# Patient Record
Sex: Male | Born: 1965 | Race: Asian | Hispanic: No | Marital: Married | Smoking: Never smoker
Health system: Southern US, Community
[De-identification: ages and names within clinical notes are randomized; demographics above are authoritative.]

## PROBLEM LIST (undated history)

## (undated) DIAGNOSIS — I10 Essential (primary) hypertension: Secondary | ICD-10-CM

## (undated) HISTORY — PX: APPENDECTOMY: SHX54

---

## 2005-11-07 ENCOUNTER — Ambulatory Visit: Payer: Self-pay | Admitting: Family Medicine

## 2005-12-04 ENCOUNTER — Ambulatory Visit: Payer: Self-pay | Admitting: Family Medicine

## 2005-12-05 ENCOUNTER — Encounter: Admission: RE | Admit: 2005-12-05 | Discharge: 2005-12-05 | Payer: Self-pay | Admitting: Family Medicine

## 2005-12-11 ENCOUNTER — Ambulatory Visit: Payer: Self-pay | Admitting: Internal Medicine

## 2005-12-14 ENCOUNTER — Ambulatory Visit: Payer: Self-pay | Admitting: Internal Medicine

## 2005-12-20 ENCOUNTER — Ambulatory Visit: Payer: Self-pay | Admitting: Internal Medicine

## 2006-01-04 ENCOUNTER — Ambulatory Visit: Payer: Self-pay | Admitting: Family Medicine

## 2006-01-23 ENCOUNTER — Ambulatory Visit: Payer: Self-pay | Admitting: Internal Medicine

## 2006-01-26 ENCOUNTER — Ambulatory Visit: Payer: Self-pay | Admitting: Cardiology

## 2006-02-07 ENCOUNTER — Ambulatory Visit: Payer: Self-pay | Admitting: Internal Medicine

## 2006-02-07 LAB — CONVERTED CEMR LAB
Eosinophils Relative: 2.5 % (ref 0.0–5.0)
HCT: 46.3 % (ref 39.0–52.0)
Hemoglobin: 16 g/dL (ref 13.0–17.0)
Lymphocytes Relative: 25.8 % (ref 12.0–46.0)
MCHC: 34.5 g/dL (ref 30.0–36.0)
MCV: 81.9 fL (ref 78.0–100.0)
Monocytes Relative: 9.7 % (ref 3.0–11.0)

## 2006-03-20 ENCOUNTER — Ambulatory Visit: Payer: Self-pay | Admitting: Internal Medicine

## 2006-04-24 ENCOUNTER — Ambulatory Visit: Payer: Self-pay | Admitting: Family Medicine

## 2006-08-16 ENCOUNTER — Ambulatory Visit: Payer: Self-pay | Admitting: Family Medicine

## 2006-10-10 ENCOUNTER — Ambulatory Visit: Payer: Self-pay | Admitting: Family Medicine

## 2007-06-20 ENCOUNTER — Ambulatory Visit: Payer: Self-pay | Admitting: Internal Medicine

## 2007-06-20 DIAGNOSIS — J309 Allergic rhinitis, unspecified: Secondary | ICD-10-CM | POA: Insufficient documentation

## 2007-06-20 DIAGNOSIS — J984 Other disorders of lung: Secondary | ICD-10-CM

## 2007-06-20 DIAGNOSIS — K921 Melena: Secondary | ICD-10-CM

## 2007-06-20 DIAGNOSIS — I10 Essential (primary) hypertension: Secondary | ICD-10-CM | POA: Insufficient documentation

## 2007-06-20 DIAGNOSIS — F411 Generalized anxiety disorder: Secondary | ICD-10-CM | POA: Insufficient documentation

## 2007-06-20 DIAGNOSIS — K589 Irritable bowel syndrome without diarrhea: Secondary | ICD-10-CM

## 2007-06-20 DIAGNOSIS — G43009 Migraine without aura, not intractable, without status migrainosus: Secondary | ICD-10-CM | POA: Insufficient documentation

## 2007-06-20 DIAGNOSIS — E785 Hyperlipidemia, unspecified: Secondary | ICD-10-CM | POA: Insufficient documentation

## 2007-06-20 DIAGNOSIS — F329 Major depressive disorder, single episode, unspecified: Secondary | ICD-10-CM

## 2007-06-20 DIAGNOSIS — K219 Gastro-esophageal reflux disease without esophagitis: Secondary | ICD-10-CM | POA: Insufficient documentation

## 2007-06-20 LAB — CONVERTED CEMR LAB
ALT: 33 units/L (ref 0–53)
Alkaline Phosphatase: 48 units/L (ref 39–117)
BUN: 12 mg/dL (ref 6–23)
Basophils Relative: 0.5 % (ref 0.0–1.0)
Calcium: 9.8 mg/dL (ref 8.4–10.5)
Chloride: 102 meq/L (ref 96–112)
Cholesterol: 243 mg/dL (ref 0–200)
Direct LDL: 171 mg/dL
GFR calc non Af Amer: 77 mL/min
HDL: 49 mg/dL (ref >39.0)
Hemoglobin: 16.6 g/dL (ref 13.0–17.0)
Ketones, ur: NEGATIVE mg/dL
Lymphocytes Relative: 22.1 % (ref 12.0–46.0)
MCV: 83.4 fL (ref 78.0–100.0)
Monocytes Absolute: 0.5 10*3/uL (ref 0.1–1.0)
Monocytes Relative: 8.2 % (ref 3.0–12.0)
Neutrophils Relative %: 67.5 % (ref 43.0–77.0)
RDW: 11.5 % (ref 11.5–14.6)
Sodium: 139 meq/L (ref 135–145)
Specific Gravity, Urine: 1.025 (ref 1.000–1.03)
Total Bilirubin: 0.7 mg/dL (ref 0.3–1.2)
Total CHOL/HDL Ratio: 5
Total Protein, Urine: NEGATIVE mg/dL
Total Protein: 8 g/dL (ref 6.0–8.3)
Triglycerides: 140 mg/dL (ref 0–149)
WBC: 6.3 10*3/uL (ref 4.5–10.5)
pH: 5.5 (ref 5.0–8.0)

## 2007-06-24 ENCOUNTER — Ambulatory Visit: Payer: Self-pay | Admitting: Cardiology

## 2007-07-15 ENCOUNTER — Ambulatory Visit: Payer: Self-pay | Admitting: Internal Medicine

## 2007-10-23 ENCOUNTER — Telehealth (INDEPENDENT_AMBULATORY_CARE_PROVIDER_SITE_OTHER): Payer: Self-pay | Admitting: *Deleted

## 2008-01-06 ENCOUNTER — Encounter: Payer: Self-pay | Admitting: Internal Medicine

## 2008-01-08 ENCOUNTER — Telehealth (INDEPENDENT_AMBULATORY_CARE_PROVIDER_SITE_OTHER): Payer: Self-pay | Admitting: *Deleted

## 2008-01-09 ENCOUNTER — Ambulatory Visit: Payer: Self-pay | Admitting: Cardiology

## 2008-01-14 ENCOUNTER — Telehealth (INDEPENDENT_AMBULATORY_CARE_PROVIDER_SITE_OTHER): Payer: Self-pay | Admitting: *Deleted

## 2008-01-23 ENCOUNTER — Ambulatory Visit: Payer: Self-pay | Admitting: Internal Medicine

## 2008-01-23 DIAGNOSIS — R1031 Right lower quadrant pain: Secondary | ICD-10-CM | POA: Insufficient documentation

## 2008-01-23 DIAGNOSIS — R21 Rash and other nonspecific skin eruption: Secondary | ICD-10-CM

## 2008-01-23 DIAGNOSIS — R079 Chest pain, unspecified: Secondary | ICD-10-CM | POA: Insufficient documentation

## 2008-01-23 DIAGNOSIS — J019 Acute sinusitis, unspecified: Secondary | ICD-10-CM | POA: Insufficient documentation

## 2008-01-23 LAB — CONVERTED CEMR LAB
Bacteria, UA: NEGATIVE
Basophils Relative: 0.2 % (ref 0.0–3.0)
Bilirubin Urine: NEGATIVE
CO2: 31 meq/L (ref 19–32)
Calcium: 9.8 mg/dL (ref 8.4–10.5)
Creatinine, Ser: 1.1 mg/dL (ref 0.4–1.5)
Crystals: NEGATIVE
GFR calc Af Amer: 94 mL/min
GFR calc non Af Amer: 77 mL/min
Glucose, Bld: 90 mg/dL (ref 70–99)
Hemoglobin: 15.8 g/dL (ref 13.0–17.0)
Ketones, ur: NEGATIVE mg/dL
Leukocytes, UA: NEGATIVE
Lymphocytes Relative: 23 % (ref 12.0–46.0)
MCV: 83 fL (ref 78.0–100.0)
Mucus, UA: NEGATIVE
Neutrophils Relative %: 65.7 % (ref 43.0–77.0)
Platelets: 193 10*3/uL (ref 150–400)
Potassium: 4.3 meq/L (ref 3.5–5.1)
RBC / HPF: NONE SEEN
RDW: 11.6 % (ref 11.5–14.6)
Sodium: 140 meq/L (ref 135–145)
Specific Gravity, Urine: 1.01 (ref 1.000–1.03)
Urine Glucose: NEGATIVE mg/dL
Urobilinogen, UA: 0.2 (ref 0.0–1.0)
WBC: 5.1 10*3/uL (ref 4.5–10.5)

## 2008-05-14 IMAGING — CT CT PELVIS W/ CM
2 of 5 series · 17 of 46 positions shown, 19 images · IV contrast (APPLIED)
Comparison: none

HISTORY: Periumbilical, left upper quadrant, and epigastric pain, weight loss,
dizziness, nausea, vomiting

[Series 2: abd_pel 5.0 b30f st · axial · 0.64mm/px · z∈[-402,-28]mm · 14 of 85 slices shown, 16 images]
[im 5/85  soft-tissue]
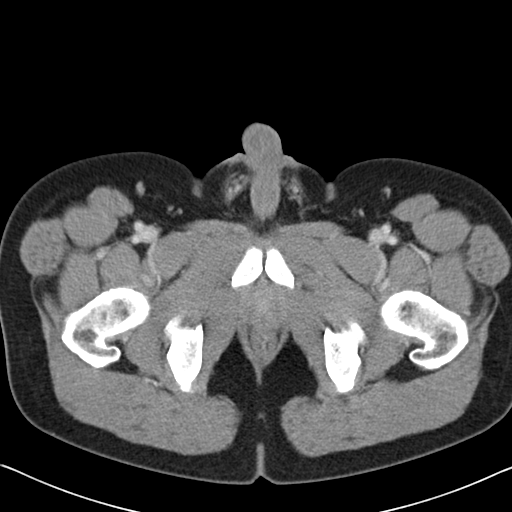
[im 5/85  bone]
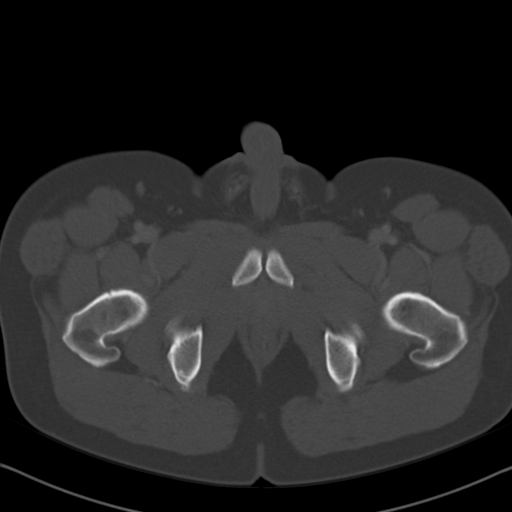
[im 9/85  soft-tissue]
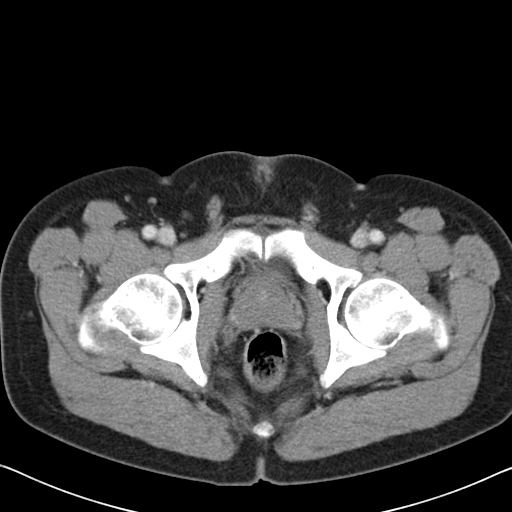
[im 18/85  soft-tissue]
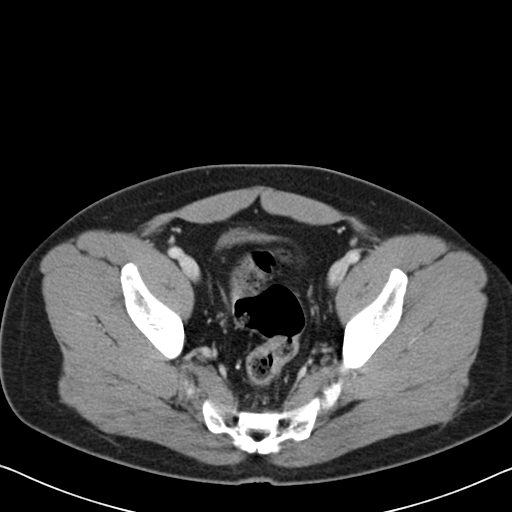
[im 23/85  soft-tissue]
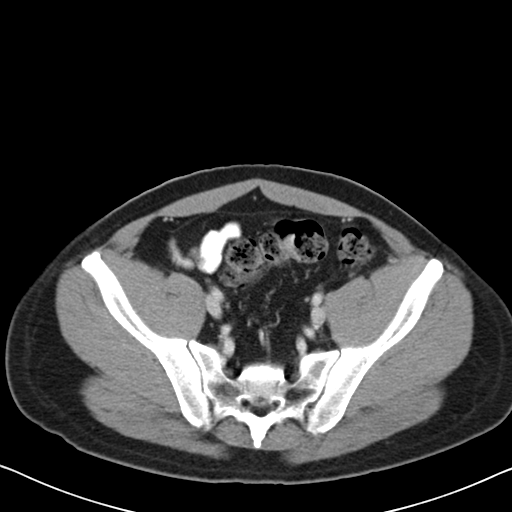
[im 27/85  soft-tissue]
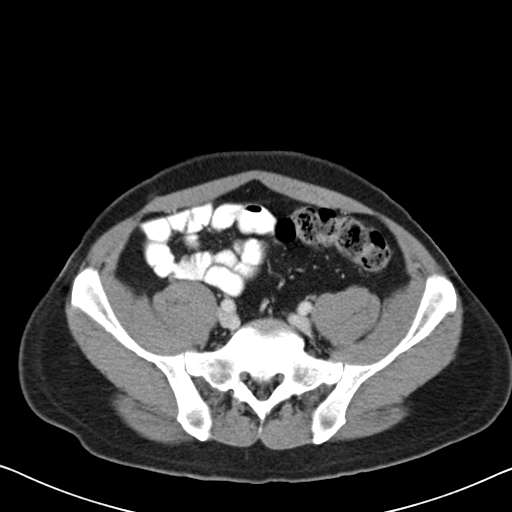
[im 36/85  soft-tissue]
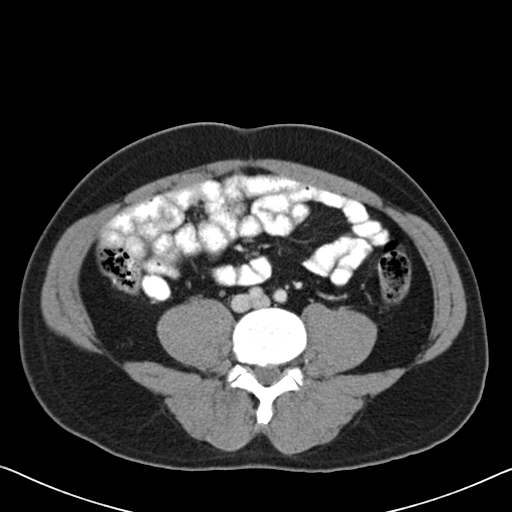
[im 40/85  soft-tissue]
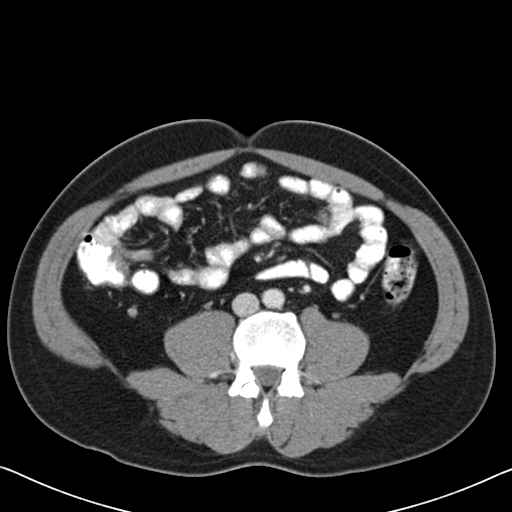
[im 45/85  soft-tissue]
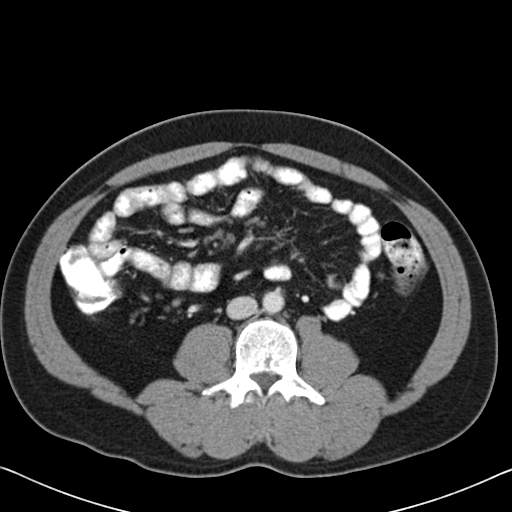
[im 49/85  soft-tissue]
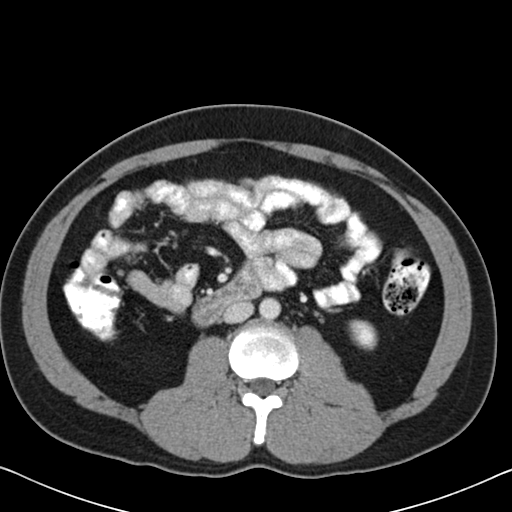
[im 49/85  bone]
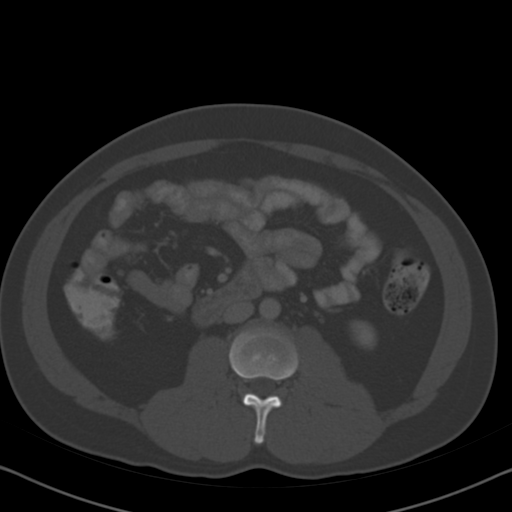
[im 58/85  soft-tissue]
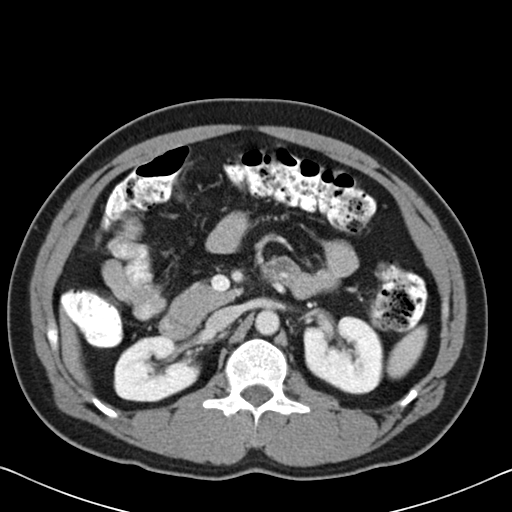
[im 62/85  soft-tissue]
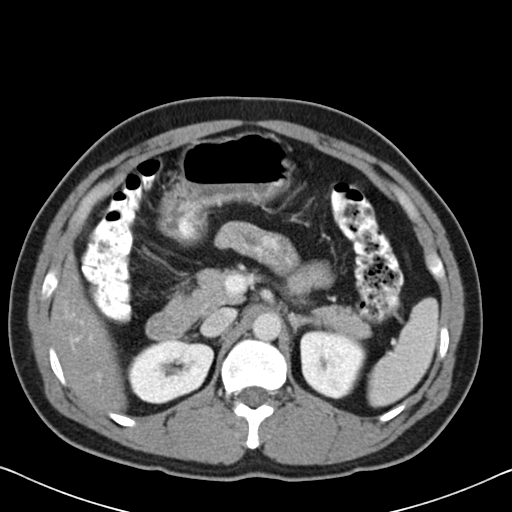
[im 67/85  soft-tissue]
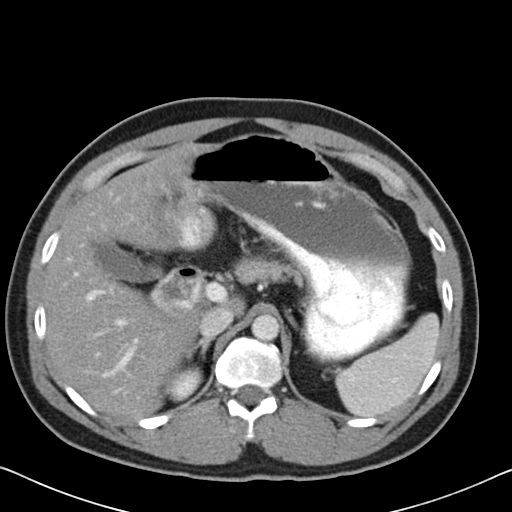
[im 76/85  soft-tissue]
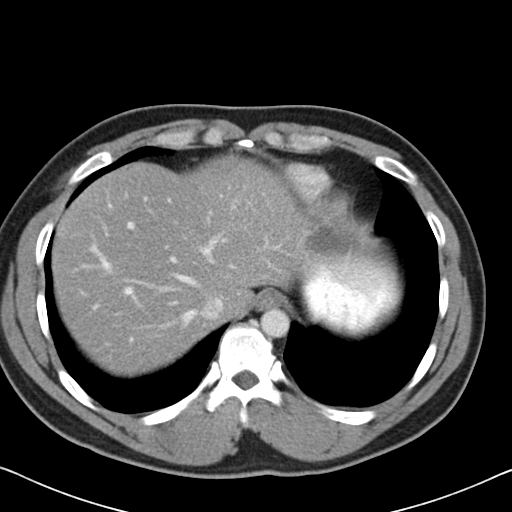
[im 80/85  soft-tissue]
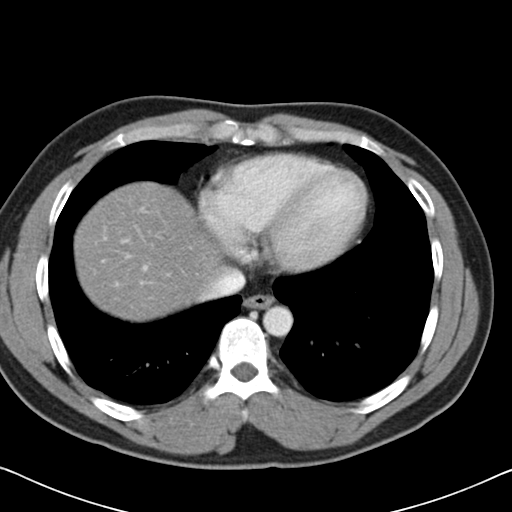

[Series 602: <mpr thick range> · coronal · 0.84mm/px · 3 of 82 slices shown]
[im 28/82  soft-tissue]
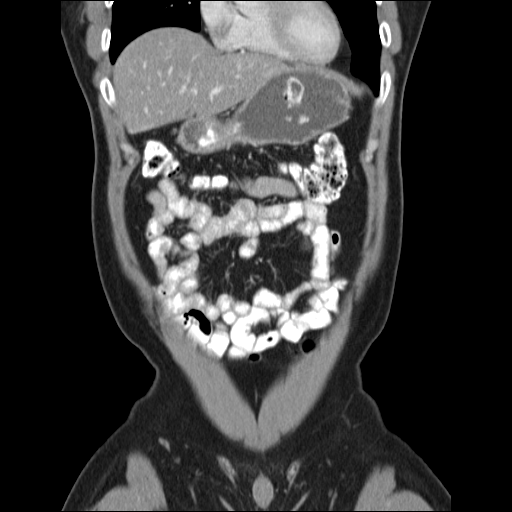
[im 37/82  soft-tissue]
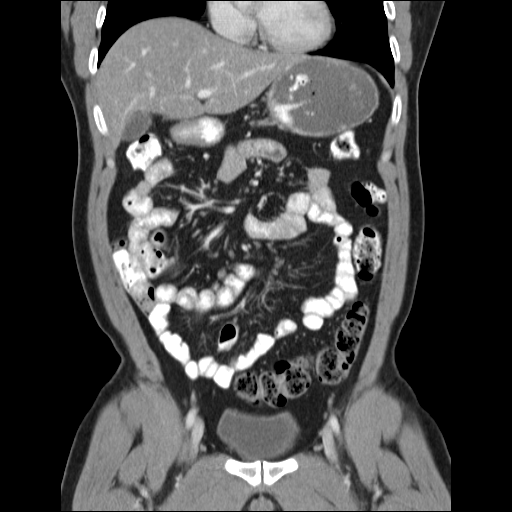
[im 46/82  soft-tissue]
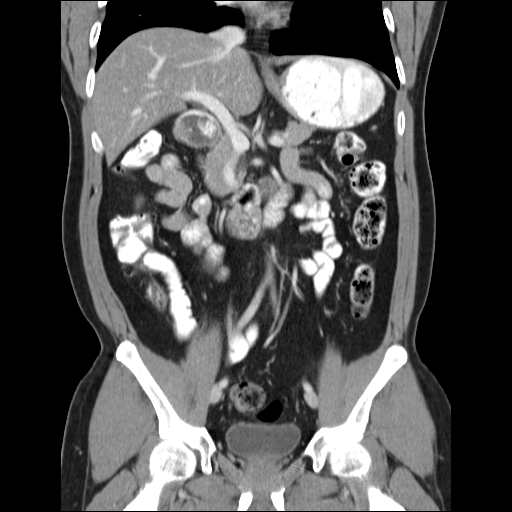

[17 of 46 positions shown; findings below may reference images not displayed]

CT ABDOMEN AND PELVIS WITH CONTRAST:

Multidetector helical CT imaging abdomen and pelvis performed.
Sagittal and coronal images are reconstructed from the axial data set.
Exam utilized dilute oral contrast and 100 cc Tmnipaque-FAA.
No prior study for comparison.

CT ABDOMEN:

Nonspecific left lower lobe pulmonary nodule 6 mm diameter image 7.
Mild diffuse fatty infiltration of liver.
Liver, spleen, pancreas, kidneys, and adrenal glands otherwise normal
appearance.
Unremarkable appearance of stomach and bowel loops in upper abdomen.
No mass, adenopathy, free fluid, or inflammatory process.
IMPRESSION: Mild fatty infiltration of liver.
No acute upper abdominal abnormalities.
Nonspecific 6 mm diameter left lower lobe pulmonary nodule.
If patient has risk factors for lung cancer, recommend followup evaluation in 6
months.
In the absence of risk factors, recommend followup in 12 months.

CT PELVIS:

Normal appearance of bladder and distal ureters.
Minimally prominent prostate gland for age 4.0 x 3.5 cm image 78.
No pelvic mass, adenopathy, free fluid, or inflammatory process.
Appendix surgically absent by history.
Bones unremarkable.
IMPRESSION: Appendectomy.
No acute pelvic abnormalities.

## 2010-05-20 NOTE — Assessment & Plan Note (Signed)
[facility]                             PULMONARY OFFICE NOTE   Bernard Hardy, Bernard Hardy                       MRN:          [id number]  DATE:12/14/2005                            DOB:          05-01-1965    REFERRING PHYSICIAN:  Sharlot Gowda, M.D.   PULMONARY CONSULTATION   CHIEF COMPLAINT:  Chest pain.   HISTORY:  A 45 year old [place] who speaks virtually no  [place] and comes in for evaluation of chest pain that has been present  for over 2 years.  This tends to happen episodically 3 to 4 times per  month, lasting several hours at a time.  The pain migrates from side to  side, but typically is worse in the left upper quadrant, always  anteriorly, never posteriorly, and never pleuritic in nature (this is  difficult to be sure of because of the language barrier).  He denies any  associated dyspnea and actually has adequate exercise tolerance despite  the stereotypical migratory pain that he is describing.  He denies any  change in bowel or bladder habits, fevers, chills, sweats, unintended  weight loss.  The patient denies the pain ever waking him from his sleep  and never has pain with exercise, typically more in the sitting  position.   PAST MEDICAL HISTORY:  Significant for hypertension.   ALLERGIES:  None known.   SOCIAL HISTORY:  He has never smoked.  [place]   FAMILY HISTORY:  Negative for respiratory diseases to his knowledge.   REVIEW OF SYSTEMS:  Taken in detail.  Negative, except as outlined  above.   PHYSICAL EXAMINATION:  This is a pleasant, healthy-appearing [place]  male in no acute distress.  VITAL SIGNS:  Stable vital signs.  HEENT:  Unremarkable.  Oropharynx is clear.  NECK:  Supple without cervical adenopathy or tenderness. Trachea is  midline.  No thyromegaly.  LUNGS:  Fields perfectly clear bilaterally to auscultation and  percussion.  CARDIAC:  Regular rate and rhythm  without murmur, gallop, or rub.  ABDOMEN:  Soft and benign.  EXTREMITIES:  Warm without calf tenderness, cyanosis, clubbing, or  edema.   Hemoglobin saturation is 95% on room air.   Chest x-ray is reviewed from December 05, 2005, and essentially normal.   IMPRESSION:  Classic irritable bowel syndrome with migratory abdominal  pain, always in the upright position, never supine, never aggravated by  exercise and not pleuritic in nature.  Worse in the left upper quadrant  where the splenic flexure occurs and tends to trap air in an upright  position.   RECOMMENDATIONS:  Citrucel 1 teaspoon b.i.d. with consideration for CT  scan of the chest and abdomen if not improve after the holidays (2 weeks  should do the trick).   I did recommend avoidance of all bean products and any products that he  knows causes gas.  I tried to do the best I could to communicate this  issue both in text and graphic format, through his interpreter.     Charlaine Dalton. Sherene Sires, MD,  FCCP  Electronically Signed    MBW/MedQ  DD: 12/14/2005  DT: 12/14/2005  Job #: [id number]   cc:   Sharlot Gowda, M.D.

## 2010-05-20 NOTE — Assessment & Plan Note (Signed)
[facility]                         GASTROENTEROLOGY OFFICE NOTE   Bernard, Hardy                       MRN:          [id number]  DATE:01/23/2006                            DOB:          09/29/1965    CHIEF COMPLAINT:  Persistent abdominal pain.   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  They were otherwise entirely normal.  This was reviewed with the  patient again today.  I prescribed Levbid which did not help his pain.  He took it for 2 weeks.  I did find out his blood pressure medicine is  HCTZ 12.5 mg daily.  At his request, I spoke to his congregational  nurse, Brantley Fling today (phone number [phone], cell, and other  number [phone]).  I explained to her what was going on, and she filled  me in that his blood pressure has been 150/118 and 140/107 at times.  He  has a lot of headaches which are thought perhaps due to bad teeth.  The  dentists have been reluctant to extract two of his teeth.  He says he  has no trouble with bowel movements.  His appetite has been good.  His  weight is down 4 pounds from December.   PAST MEDICAL HISTORY:  Reviewed and unchanged from note in December.   PHYSICAL EXAMINATION:  VITAL SIGNS:  Weight 148.4 pounds.  Pulse 60,  blood pressure 100/60.  ABDOMEN:  Mild-to-moderate periumbilical tenderness.  Bowel sounds are  increased.  There are no bruits heard.  There is a right lower quadrant  scar from prior appendectomy.   ASSESSMENT:  Persistent abdominal pain, unclear etiology.  Irritable  bowel syndrome certainly seems likely, but he is loosing some weight.  The heme-positive stool could have come from his arteriovenous  malformation.  At one point I considered the possibility of  pheochromocytoma, but I am not sure his blood pressure is high enough.   PLAN:  CT of the abdomen and pelvis  with IV and oral contrast to  investigate these symptoms and the weight loss.  He is also tender in  the periumbilical area.  If that is unrevealing, would try other  antispasmodics I think.  At some point, we will need to recheck his CBC  given the previous heme-positive stool.  Further plans pending clinical  course.  I will see him back in about a week after his CT.     Iva Boop, MD,FACG  Electronically Signed    CEG/MedQ  DD: 01/23/2006  DT: 01/23/2006  Job #: [id number]   cc:   Sharlot Gowda, M.D.

## 2010-05-20 NOTE — Assessment & Plan Note (Signed)
[facility]                         GASTROENTEROLOGY OFFICE NOTE   Bernard Hardy, Bernard Hardy                       MRN:          [id number]  DATE:03/20/2006                            DOB:          [birth year]    CHIEF COMPLAINT:  Persistent right lower quadrant pain.   I spoke to [name] through an interpreter. He continues to have some  pain in the right lower quadrant. When I examined him, he was tender  right over his previous appendectomy scar. Extensive workup outlined  previously has been unrevealing. He has been tried on hyoscyamine and  dicyclomine without relief. As I palpate, he is only tender at the site  of the scar. His weight is overall stable, down a pound or two, but  really seems okay. He does not seem to have radicular or sciatic  symptoms that I can tell. He is also complaining of rash on the right  instep of the foot that is irritated and pruritic. The skin looks rough  in that area and a little bit nodular, and he scrapes it with his finger  and says it itches through the interpreter.   ASSESSMENT:  I am not sure where his right lower quadrant pain is coming  from. However, he indicates that he has had pain since a few months or  so after his appendectomy years ago. I think he may have pain related to  his scar tissue or nerve damage from his appendectomy. I think he also  probably had some irritable bowel problems. Colonoscopy and CT scanning  have been unrevealing. He has had an upper GI endoscopy because of  problems with other abdominal pain that was negative as well.   He has a skin rash. I have suggested some hydrocortisone cream for that  to try, and if that over-the-counter agent does not work, he should see  his primary care physician.   Lidoderm patch is prescribed to place over his surgical scar to see if  that helps his pain. I tried to reassure him as best I could in that  there do not seem to be any serious  etiologies to this problem. He does  have a small lung nodule on his CT scan that needs followup at some  point, and I have deferred to pulmonary and primary care on that.   CT scan January 26, 2006 showed a nonspecific left lower lobe pulmonary  nodule 6 mm. The patient has no obvious risk factors for lung cancer,  and the radiologist recommended a limited noncontrast CT of the chest in  12 months.     Iva Boop, MD,FACG  Electronically Signed    CEG/MedQ  DD: 03/21/2006  DT: 03/22/2006  Job #: [id number]   cc:   Sharlot Gowda, M.D.  Charlaine Dalton. Sherene Sires, MD, FCCP

## 2010-05-20 NOTE — Assessment & Plan Note (Signed)
[facility]                         GASTROENTEROLOGY OFFICE NOTE   Okley, Magnussen KEKOA FYOCK                       MRN:          [id number]  DATE:02/07/2006                            DOB:          21-Nov-1965    CHIEF COMPLAINT:  Persistent movement and gurgling in his stomach.   I had him do an abdominopelvic CT, which showed no abdominal pathology.  He had a 6-mm nonspecific left lower lobe pulmonary nodule.  He does not  smoke.  There were no real risk factors for cancer.  His weight has  stabilized.  He had a minimally enlarged prostate gland.  He is  complaining about a 2-year history of headaches today as well.  Previously, Levbid did not really help.  An EGD and a colonoscopy was  unrevealing.   Weight 147 pounds.  Pulse 92.  Blood pressure 110/68.  ABDOMEN:  Soft and non-tender.  No organomegaly or mass.  Cranial nerves 2 through 12 grossly intact.   ASSESSMENT:  Persistent abdominal complaints with borborygmi .  He is  also complaining of some sweats and some mild periumbilical and  epigastric pain as before.  This happens about twice a week.  I think  this is irritable bowel syndrome.   PLAN:  1. Check CBC, sed rate.  2. As long as those are okay, we will continue symptomatic treatment.      I have prescribed dicyclomine 20 mg q. 6 hours p.r.n. pain.  3. Regarding this lung lesion, we will refer to Dr. Susann Givens and/or Dr.      Sherene Sires.  The radiologist suggested a followup in a year would be      reasonable.  He had a PA and lateral chest x-ray recently that was      entirely negative.  4. He is to return to Dr. Susann Givens regarding these issues and the      headache as desired.  I will see him back as needed.  If he is      anemic or his sed rate is up, I will consider further evaluation,      but at this point, I think it would be unlikely.     Iva Boop, MD,FACG  Electronically Signed    CEG/MedQ  DD: 02/07/2006  DT: 02/07/2006   Job #: [id number]   cc:   Sharlot Gowda, M.D.

## 2010-05-20 NOTE — Assessment & Plan Note (Signed)
[facility]                         GASTROENTEROLOGY OFFICE NOTE   Bernard Hardy, Bernard Hardy                       MRN:          [id number]  DATE:12/11/2005                            DOB:          Apr 15, 1965    REFERRING PHYSICIAN:  Sharlot Gowda, M.D.   CHIEF COMPLAINT:  Hemoccult positive, chest and abdominal pain.   ASSESSMENT:  A 45 year old [place] man who has had at least a several  month history of three or four loose stools a day.  He had some  intermittent black stools which could be melena.  He was found to be  hemoccult positive at Dr. Jola Babinski office.  There is intermittent mid-  abdominal pain, mainly periumbilical that occurs without obvious  precipitant.  His hemoglobin was normal on November 07, 2005.  He has had  chest pain that is a radiation of this mid-abdominal pain.  It is not  related to activity.   RECOMMENDATIONS/PLAN:  Schedule upper GI endoscopy and colonoscopy to  investigate these signs and symptoms.  Further plans pending that.  I  have explained this to the patient through an interpreter.   HISTORY:  As above.  He was found to have hemoccult positive stool  recently.  He has been complaining of loose bowel movements sometimes  black.  The hemoccult stool is on a rectal exam on December 04, 2005.  His CBC was normal on November 07, 2005.  His CMET was normal except for  a slightly elevated ALT of 54 with top normal 53.  Cholesterol 216,  triglycerides 229, LDL 126.  An EKG looks normal, though there could be  right ventricular conduction delay.   He has not had any nausea, vomiting or gross blood in the stool other  than this possible melena.  He does not seem to use Pepto-Bismol.   MEDICATIONS:  Blood pressure medication.   PAST SURGICAL HISTORY:  Appendectomy.   PAST MEDICAL HISTORY:  Hypertension.   FAMILY HISTORY:  Noncontributory.   SOCIAL HISTORY:  He is widowed.  He has three sons and two daughters.  He  works in a [employer].  Lives with his children.  No alcohol,  tobacco or drugs.   REVIEW OF SYSTEMS:  Negative.  Recent PPD and chest x-ray:  Results  pending.  He has had some dyspnea that may go along with this shortness  of breath and is awaiting an evaluation by Dr. Sandrea Hughs of our  pulmonary division.  All other systems appear negative.   PHYSICAL EXAMINATION:  Reveals a pleasant, well-developed Asian man.  Height 5 feet 3.  Weight 152 pounds.  Blood pressure 120/80, pulse 95.  EYES:  Anicteric.  MOUTH:  Free of lesions.  NECK:  Supple.  No thyromegaly or mass.  CHEST:  Clear.  HEART:  S1-S2.  No rubs or gallops.  ABDOMEN:  Soft and nontender without organomegaly or mass.  RECTAL EXAM:  Deferred.  LYMPHATIC:  No neck or supraclavicular adenopathy.  EXTREMITIES:  No peripheral edema.  SKIN:  Warm, dry.  No rash.  PSYCHIATRIC:  He appears alert and oriented x3  as best as I can tell  through the interpreter.   I appreciate the opportunity to care for this patient.   Note, I have reviewed labs, office notes kindly furnished by Dr.  Susann Givens.  Chest x-ray was negative for any active cardiopulmonary  disease.  Note, the patient was confused and did not realize he had an  appointment this morning with Dr. Sherene Sires.  So, we rescheduled that patient  visit for him for later in the week.     Iva Boop, MD,FACG  Electronically Signed    CEG/MedQ  DD: 12/11/2005  DT: 12/12/2005  Job #: [id number]   cc:   Sharlot Gowda, M.D.

## 2013-12-31 ENCOUNTER — Encounter: Payer: Self-pay | Admitting: Internal Medicine

## 2014-07-29 ENCOUNTER — Emergency Department (HOSPITAL_COMMUNITY)
Admission: EM | Admit: 2014-07-29 | Discharge: 2014-07-29 | Disposition: A | Discharge status: Home / Self Care | Payer: BLUE CROSS/BLUE SHIELD | Attending: Emergency Medicine | Admitting: Emergency Medicine

## 2014-07-29 ENCOUNTER — Encounter (HOSPITAL_COMMUNITY): Payer: Self-pay | Admitting: Emergency Medicine

## 2014-07-29 DIAGNOSIS — R1012 Left upper quadrant pain: Secondary | ICD-10-CM | POA: Insufficient documentation

## 2014-07-29 DIAGNOSIS — I10 Essential (primary) hypertension: Secondary | ICD-10-CM | POA: Insufficient documentation

## 2014-07-29 DIAGNOSIS — R112 Nausea with vomiting, unspecified: Secondary | ICD-10-CM | POA: Diagnosis not present

## 2014-07-29 HISTORY — DX: Essential (primary) hypertension: I10

## 2014-07-29 LAB — CBC
HEMATOCRIT: 44.7 % (ref 39.0–52.0)
Hemoglobin: 14.6 g/dL (ref 13.0–17.0)
MCH: 27.7 pg (ref 26.0–34.0)
MCHC: 32.7 g/dL (ref 30.0–36.0)
MCV: 84.7 fL (ref 78.0–100.0)
Platelets: 174 10*3/uL (ref 150–400)
RBC: 5.28 MIL/uL (ref 4.22–5.81)
RDW: 12.3 % (ref 11.5–15.5)
WBC: 6.2 10*3/uL (ref 4.0–10.5)

## 2014-07-29 LAB — COMPREHENSIVE METABOLIC PANEL
ALBUMIN: 4 g/dL (ref 3.5–5.0)
ALK PHOS: 47 U/L (ref 38–126)
ALT: 31 U/L (ref 17–63)
AST: 26 U/L (ref 15–41)
Anion gap: 8 (ref 5–15)
BILIRUBIN TOTAL: 0.7 mg/dL (ref 0.3–1.2)
BUN: 13 mg/dL (ref 6–20)
CALCIUM: 9 mg/dL (ref 8.9–10.3)
CO2: 24 mmol/L (ref 22–32)
Chloride: 106 mmol/L (ref 101–111)
Creatinine, Ser: 1.16 mg/dL (ref 0.61–1.24)
GFR calc Af Amer: >60 mL/min (ref >60)
GLUCOSE: 107 mg/dL — AB (ref 65–99)
Potassium: 3.7 mmol/L (ref 3.5–5.1)
SODIUM: 138 mmol/L (ref 135–145)
TOTAL PROTEIN: 7.1 g/dL (ref 6.5–8.1)

## 2014-07-29 LAB — URINALYSIS, ROUTINE W REFLEX MICROSCOPIC
BILIRUBIN URINE: NEGATIVE
Glucose, UA: NEGATIVE mg/dL
Hgb urine dipstick: NEGATIVE
Ketones, ur: NEGATIVE mg/dL
LEUKOCYTES UA: NEGATIVE
Nitrite: NEGATIVE
PH: 5.5 (ref 5.0–8.0)
Protein, ur: NEGATIVE mg/dL
SPECIFIC GRAVITY, URINE: 1.018 (ref 1.005–1.030)
UROBILINOGEN UA: 0.2 mg/dL (ref 0.0–1.0)

## 2014-07-29 LAB — LIPASE, BLOOD: Lipase: 39 U/L (ref 22–51)

## 2014-07-29 MED ORDER — ONDANSETRON HCL 4 MG/2ML IJ SOLN
4.0000 mg | Freq: Once | INTRAMUSCULAR | Status: AC
  Administered 2014-07-29: 4 mg via INTRAVENOUS
  Filled 2014-07-29: qty 2

## 2014-07-29 MED ORDER — ONDANSETRON HCL 4 MG PO TABS: 4.0000 mg | ORAL_TABLET | Freq: Four times a day (QID) | ORAL | Status: DC

## 2014-07-29 MED ORDER — MORPHINE SULFATE 4 MG/ML IJ SOLN
4.0000 mg | Freq: Once | INTRAMUSCULAR | Status: AC
  Administered 2014-07-29: 4 mg via INTRAVENOUS
  Filled 2014-07-29: qty 1

## 2014-07-29 NOTE — ED Provider Notes (Signed)
CSN: [id number]     Arrival date & time 07/29/14  4098 History   First MD Initiated Contact with Patient 07/29/14 [phone]     Chief Complaint  Patient presents with  . Abdominal Pain     Patient is a 49 y.o. male presenting with abdominal pain. The history is provided by the patient and a relative.  Abdominal Pain Pain location:  LUQ Pain quality: aching   Pain radiates to:  Does not radiate Pain severity:  Moderate Onset quality:  Sudden Duration:  10 hours Timing:  Constant Progression:  Worsening Chronicity:  New Relieved by:  Nothing Worsened by:  Movement and palpation Associated symptoms: nausea and vomiting   Associated symptoms: no chest pain and no fever    Pt reports onset of LUQ pain last night at work He reports one episode of vomiting No fever No diarrhea No CP is reported  Pt is vietnamese but he speaks [place] Past Medical History  Diagnosis Date  . Hypertension    Past Surgical History  Procedure Laterality Date  . Appendectomy     No family history on file. History  Substance Use Topics  . Smoking status: Never Smoker   . Smokeless tobacco: Not on file  . Alcohol Use: No    Review of Systems  Constitutional: Negative for fever.  Cardiovascular: Negative for chest pain.  Gastrointestinal: Positive for nausea, vomiting and abdominal pain.  All other systems reviewed and are negative.     Allergies  Review of patient's allergies indicates no known allergies.  Home Medications   Prior to Admission medications   Not on File   BP 148/91 mmHg  Pulse 71  Temp(Src) 98 F (36.7 C) (Oral)  Resp 17  Ht 5\' 3"  (1.6 m)  Wt 149 lb (67.586 kg)  BMI 26.40 kg/m2  SpO2 99% Physical Exam CONSTITUTIONAL: Well developed/well nourished HEAD: Normocephalic/atraumatic EYES: EOMI/PERRL ENMT: Mucous membranes moist NECK: supple no meningeal signs SPINE/BACK:entire spine nontender CV: S1/S2 noted, no murmurs/rubs/gallops noted LUNGS: Lungs are clear  to auscultation bilaterally, no apparent distress ABDOMEN: soft, moderate LUQ tenderness noted, no rebound or guarding, bowel sounds noted throughout abdomen GU:no cva tenderness NEURO: Pt is awake/alert/appropriate, moves all extremitiesx4.  No facial droop.   EXTREMITIES: pulses normal/equal, full ROM SKIN: warm, color normal PSYCH: no abnormalities of mood noted, alert and oriented to situation  ED Course  Procedures  6:55 AM Will treat pain and reassess 7:58 AM Pt with improved pain At signout to dr Jodi Mourning, recheck patient's abd pain.  He will be given PO challenge   Labs Review Labs Reviewed  COMPREHENSIVE METABOLIC PANEL - Abnormal; Notable for the following:    Glucose, Bld 107 (*)    All other components within normal limits  URINALYSIS, ROUTINE W REFLEX MICROSCOPIC (NOT AT [facility]) - Abnormal; Notable for the following:    APPearance CLOUDY (*)    All other components within normal limits  LIPASE, BLOOD  CBC    Imaging Review No results found.   EKG Interpretation   Date/Time:  Wednesday July 29 2014 07:53:22 EDT Ventricular Rate:  61 PR Interval:  159 QRS Duration: 98 QT Interval:  402 QTC Calculation: 405 R Axis:   30 Text Interpretation:  Sinus rhythm RSR' in V1 or V2, right VCD or RVH No  previous ECGs available Confirmed by Bebe Shaggy  MD, Tobey Lippard ([id number]) on  07/29/2014 7:57:28 AM     Medications  morphine 4 MG/ML injection 4 mg (4 mg Intravenous Given  07/29/14 0720)  ondansetron (ZOFRAN) injection 4 mg (4 mg Intravenous Given 07/29/14 0720)    MDM   Final diagnoses:  LUQ abdominal pain    Nursing notes including past medical history and social history reviewed and considered in documentation Labs/vital reviewed myself and considered during evaluation     Zadie Rhine, MD 07/29/14 [phone]

## 2014-07-29 NOTE — Discharge Instructions (Signed)
If you were given medicines take as directed.  If you are on coumadin or contraceptives realize their levels and effectiveness is altered by many different medicines.  If you have any reaction (rash, tongues swelling, other) to the medicines stop taking and see a physician.    If your blood pressure was elevated in the ER make sure you follow up for management with a primary doctor or return for chest pain, shortness of breath or stroke symptoms.  Please follow up as directed and return to the ER or see a physician for new or worsening symptoms.  Thank you. Filed Vitals:   07/29/14 0930 07/29/14 0945 07/29/14 1000 07/29/14 1015  BP: 117/76 116/74 119/80 147/95  Pulse: 59 60 64 57  Temp:      TempSrc:      Resp: Height:      Weight:      SpO2: 96% 98% 96% 99%

## 2014-07-29 NOTE — ED Provider Notes (Signed)
.   Patient's care signed out to follow-up on pain control and vomiting. Patient presented with abdominal pain prior physician did general lab work and supportive care, patient's symptoms improved significantly abdominal pain basically resolved, no vomiting. Vital signs unremarkable mild elevated blood pressure. Patient has soft abdomen nontender this time no peritonitis. Patient well-appearing prior to discharge. Discussed follow-up in 2 days if worsening symptoms or return of symptoms.  Blane Ohara Enid Skeens, MD 07/29/14 [phone]

## 2014-07-29 NOTE — ED Notes (Signed)
Pt is in stable condition upon d/c and is escorted from ED via wheelchair. 

## 2014-07-29 NOTE — ED Notes (Signed)
Pt. reports LUQ pain with emesis( x1) onset last night , denies diarrhea , no fever or chills.

## 2017-03-10 ENCOUNTER — Emergency Department (HOSPITAL_COMMUNITY): Payer: BLUE CROSS/BLUE SHIELD

## 2017-03-10 ENCOUNTER — Other Ambulatory Visit: Payer: Self-pay

## 2017-03-10 ENCOUNTER — Encounter (HOSPITAL_COMMUNITY): Payer: Self-pay | Admitting: *Deleted

## 2017-03-10 ENCOUNTER — Emergency Department (HOSPITAL_COMMUNITY)
Admission: EM | Admit: 2017-03-10 | Discharge: 2017-03-10 | Disposition: A | Discharge status: Home / Self Care | Payer: BLUE CROSS/BLUE SHIELD | Attending: Emergency Medicine | Admitting: Emergency Medicine

## 2017-03-10 DIAGNOSIS — R51 Headache: Secondary | ICD-10-CM | POA: Diagnosis not present

## 2017-03-10 DIAGNOSIS — Z79899 Other long term (current) drug therapy: Secondary | ICD-10-CM | POA: Insufficient documentation

## 2017-03-10 DIAGNOSIS — R0789 Other chest pain: Secondary | ICD-10-CM

## 2017-03-10 DIAGNOSIS — R42 Dizziness and giddiness: Secondary | ICD-10-CM | POA: Insufficient documentation

## 2017-03-10 DIAGNOSIS — R110 Nausea: Secondary | ICD-10-CM | POA: Diagnosis not present

## 2017-03-10 DIAGNOSIS — I10 Essential (primary) hypertension: Secondary | ICD-10-CM | POA: Diagnosis not present

## 2017-03-10 DIAGNOSIS — R1013 Epigastric pain: Secondary | ICD-10-CM

## 2017-03-10 DIAGNOSIS — R1012 Left upper quadrant pain: Secondary | ICD-10-CM | POA: Insufficient documentation

## 2017-03-10 DIAGNOSIS — R519 Headache, unspecified: Secondary | ICD-10-CM

## 2017-03-10 LAB — CBC
HEMATOCRIT: 46 % (ref 39.0–52.0)
HEMOGLOBIN: 15.1 g/dL (ref 13.0–17.0)
MCH: 27.7 pg (ref 26.0–34.0)
MCHC: 32.8 g/dL (ref 30.0–36.0)
MCV: 84.4 fL (ref 78.0–100.0)
PLATELETS: 194 10*3/uL (ref 150–400)
RBC: 5.45 MIL/uL (ref 4.22–5.81)
RDW: 12.1 % (ref 11.5–15.5)
WBC: 6.9 10*3/uL (ref 4.0–10.5)

## 2017-03-10 LAB — HEPATIC FUNCTION PANEL
ALT: 41 U/L (ref 17–63)
AST: 30 U/L (ref 15–41)
Albumin: 4.2 g/dL (ref 3.5–5.0)
Alkaline Phosphatase: 45 U/L (ref 38–126)
Bilirubin, Direct: <0.1 mg/dL — ABNORMAL LOW (ref 0.1–0.5)
Total Bilirubin: 0.9 mg/dL (ref 0.3–1.2)
Total Protein: 8 g/dL (ref 6.5–8.1)

## 2017-03-10 LAB — URINALYSIS, ROUTINE W REFLEX MICROSCOPIC
Bilirubin Urine: NEGATIVE
Glucose, UA: NEGATIVE mg/dL
Hgb urine dipstick: NEGATIVE
Ketones, ur: NEGATIVE mg/dL
Leukocytes, UA: NEGATIVE
Nitrite: NEGATIVE
Protein, ur: NEGATIVE mg/dL
Specific Gravity, Urine: 1.006 (ref 1.005–1.030)
pH: 5 (ref 5.0–8.0)

## 2017-03-10 LAB — BASIC METABOLIC PANEL
ANION GAP: 12 (ref 5–15)
BUN: 10 mg/dL (ref 6–20)
CHLORIDE: 103 mmol/L (ref 101–111)
CO2: 25 mmol/L (ref 22–32)
CREATININE: 1.15 mg/dL (ref 0.61–1.24)
Calcium: 9.2 mg/dL (ref 8.9–10.3)
GFR calc non Af Amer: >60 mL/min (ref >60)
Glucose, Bld: 109 mg/dL — ABNORMAL HIGH (ref 65–99)
POTASSIUM: 3.5 mmol/L (ref 3.5–5.1)
Sodium: 140 mmol/L (ref 135–145)

## 2017-03-10 LAB — I-STAT TROPONIN, ED
Troponin i, poc: 0 ng/mL (ref 0.00–0.08)
Troponin i, poc: 0 ng/mL (ref 0.00–0.08)

## 2017-03-10 LAB — LIPASE, BLOOD: Lipase: 37 U/L (ref 11–51)

## 2017-03-10 MED ORDER — METOCLOPRAMIDE HCL 5 MG/ML IJ SOLN
10.0000 mg | Freq: Once | INTRAMUSCULAR | Status: AC
  Administered 2017-03-10: 10 mg via INTRAVENOUS
  Filled 2017-03-10: qty 2

## 2017-03-10 MED ORDER — DIPHENHYDRAMINE HCL 50 MG/ML IJ SOLN
25.0000 mg | Freq: Once | INTRAMUSCULAR | Status: AC
  Administered 2017-03-10: 25 mg via INTRAVENOUS
  Filled 2017-03-10: qty 1

## 2017-03-10 MED ORDER — OMEPRAZOLE 20 MG PO CPDR: 20.0000 mg | DELAYED_RELEASE_CAPSULE | Freq: Two times a day (BID) | ORAL | 0 refills | Status: AC

## 2017-03-10 MED ORDER — KETOROLAC TROMETHAMINE 30 MG/ML IJ SOLN
30.0000 mg | Freq: Once | INTRAMUSCULAR | Status: AC
  Administered 2017-03-10: 30 mg via INTRAVENOUS
  Filled 2017-03-10: qty 1

## 2017-03-10 MED ORDER — FAMOTIDINE IN NACL 20-0.9 MG/50ML-% IV SOLN
20.0000 mg | Freq: Once | INTRAVENOUS | Status: AC
  Administered 2017-03-10: 20 mg via INTRAVENOUS
  Filled 2017-03-10: qty 50

## 2017-03-10 MED ORDER — SODIUM CHLORIDE 0.9 % IV BOLUS (SEPSIS)
1000.0000 mL | Freq: Once | INTRAVENOUS | Status: AC
  Administered 2017-03-10: 1000 mL via INTRAVENOUS

## 2017-03-10 MED ORDER — SUCRALFATE 1 G PO TABS: 1.0000 g | ORAL_TABLET | Freq: Three times a day (TID) | ORAL | 0 refills | Status: AC

## 2017-03-10 NOTE — Discharge Instructions (Signed)
Your urine did not show any signs of infection.  Start taking Prilosec and Carafate for your abdominal pain.  Avoid fried foods or greasy foods that may upset your stomach.  Follow-up with your primary care physician for reevaluation of your symptoms.  You may also call the phone number on the back of your insurance card to help set you up with another primary care doctor.  Return to the emergency department if any concerning signs or symptoms develop such as severe headache, chest pain, severe abdominal pain, persistent vomiting, or fevers.

## 2017-03-10 NOTE — ED Triage Notes (Signed)
Pt in c/o chest pain that radiates into his back x2 weeks, also headache, worse this morning, denies n/v, no distress noted

## 2017-03-10 NOTE — ED Notes (Signed)
Green interpreter ipad machine used- Interp number [id number] for assessment

## 2017-03-10 NOTE — ED Provider Notes (Signed)
[facility] EMERGENCY DEPARTMENT Provider Note   CSN: [id number] Arrival date & time: 03/10/17  4098     History   Chief Complaint Chief Complaint  Patient presents with  . Chest Pain    HPI Bernard Hardy is a 52 y.o. male with history of hyperlipidemia, anxiety, hypertension, migraines, GERD presents today for evaluation of gradual onset, waxing and waning epigastric pain for 2 weeks.  He states pain is sharp and stabbing, radiates to the left upper quadrant and up into his chest and into the back.  He states it occurs randomly, not associated with exertion or mealtimes.  He endorses nausea but no vomiting.  Denies shortness of breath.  Denies urinary symptoms, melena, hematochezia, diarrhea, or constipation.  Has not tried anything for his symptoms.  He also notes that for the past 2 weeks he has been experiencing left-sided headaches which do not radiate.  He describes them as a pins and needle sensation with associated lightheadedness, especially when going from a sitting to standing position.  He denies syncope.  He denies numbness, [place], weakness, fevers, neck pain.  He is a non-smoker.  He denies alcohol use or caffeine intake.  He is primarily [place] speaking and a translator was used throughout the encounter.  The history is provided by the patient. The history is limited by a language barrier. A language interpreter was used.    Past Medical History:  Diagnosis Date  . Hypertension     Patient Active Problem List   Diagnosis Date Noted  . SINUSITIS- ACUTE-NOS 01/23/2008  . RASH-NONVESICULAR 01/23/2008  . CHEST PAIN 01/23/2008  . ABDOMINAL PAIN, RIGHT LOWER QUADRANT 01/23/2008  . HYPERLIPIDEMIA 06/20/2007  . ANXIETY 06/20/2007  . Depressive disorder, not elsewhere classified 06/20/2007  . COMMON MIGRAINE 06/20/2007  . HYPERTENSION 06/20/2007  . ALLERGIC RHINITIS 06/20/2007  . 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 GERD 06/20/2007  . IBS 06/20/2007   . HEMATOCHEZIA 06/20/2007    Past Surgical History:  Procedure Laterality Date  . APPENDECTOMY         Home Medications    Prior to Admission medications   Medication Sig Start Date End Date Taking? Authorizing Provider  Multiple Vitamins-Minerals (MULTIVITAMIN ADULT PO) Take 1 tablet by mouth daily.    [provider]  omeprazole (PRILOSEC) 20 MG capsule Take 1 capsule (20 mg total) by mouth 2 (two) times daily before a meal for 14 days. 03/10/17 03/24/17  Michela Pitcher A, PA-C  ondansetron (ZOFRAN) 4 MG tablet Take 1 tablet (4 mg total) by mouth every 6 (six) hours. 07/29/14   Blane Ohara, MD  sucralfate (CARAFATE) 1 g tablet Take 1 tablet (1 g total) by mouth 4 (four) times daily -  with meals and at bedtime for 14 days. 03/10/17 03/24/17  Jeanie Sewer, PA-C    Family History History reviewed. No pertinent family history.  Social History Social History   Tobacco Use  . Smoking status: Never Smoker  Substance Use Topics  . Alcohol use: No  . Drug use: No     Allergies   Patient has no known allergies.   Review of Systems Review of Systems  Constitutional: Negative for chills and fever.  Eyes: Negative for photophobia and visual disturbance.  Respiratory: Negative for shortness of breath.   Cardiovascular: Positive for chest pain.  Gastrointestinal: Positive for abdominal pain and nausea. Negative for constipation, diarrhea and vomiting.  Genitourinary: Negative for dysuria, flank pain, hematuria and urgency.  Musculoskeletal: Positive  for back pain.  Neurological: Positive for light-headedness and headaches. Negative for syncope, weakness and numbness.  All other systems reviewed and are negative.    Physical Exam Updated Vital Signs BP 121/81   Pulse 70   Temp 98.4 F (36.9 C) (Oral)   Resp 19   SpO2 97%   Physical Exam  Constitutional: He is oriented to person, place, and time. He appears well-developed and well-nourished. No distress.  HENT:    Head: Normocephalic and atraumatic.  Eyes: Conjunctivae and EOM are normal. Pupils are equal, round, and reactive to light. Right eye exhibits no discharge. Left eye exhibits no discharge.  Neck: Normal range of motion. Neck supple. No JVD present. No tracheal deviation present.  Cardiovascular: Normal rate and regular rhythm.  Pulses:      Carotid pulses are 2+ on the right side, and 2+ on the left side.      Radial pulses are 2+ on the right side, and 2+ on the left side.       Dorsalis pedis pulses are 2+ on the right side, and 2+ on the left side.       Posterior tibial pulses are 2+ on the right side, and 2+ on the left side.  Pulmonary/Chest: Effort normal and breath sounds normal. No accessory muscle usage or stridor. No tachypnea. No respiratory distress.  Abdominal: Soft. Bowel sounds are normal. He exhibits no distension and no mass. There is tenderness. There is no guarding.  Tenderness to palpation in the epigastric and left upper quadrant regions.  Murphy sign absent, Rovsing's absent, no CVA tenderness.  Musculoskeletal: Normal range of motion. He exhibits no edema.       Right lower leg: Normal.       Left lower leg: Normal.  Neurological: He is alert and oriented to person, place, and time.  Mental Status:  Alert, thought content appropriate, able to give a coherent history. Speech fluent without evidence of aphasia. Able to follow 2 step commands without difficulty.  Cranial Nerves:  II:  Peripheral visual fields grossly normal, pupils equal, round, reactive to light III,IV, VI: ptosis not present, extra-ocular motions intact bilaterally  V,VII: smile symmetric, facial light touch sensation equal VIII: hearing grossly normal to voice  X: uvula elevates symmetrically  XI: bilateral shoulder shrug symmetric and strong XII: midline tongue extension without fassiculations Motor:  Normal tone. 5/5 strength of BUE and BLE major muscle groups including strong and equal grip  strength and dorsiflexion/plantar flexion Sensory: light touch normal in all extremities. Cerebellar: normal finger-to-nose with bilateral upper extremities Gait: normal gait and balance. Able to walk on toes and heels with ease.     Skin: Skin is warm and dry. No erythema.  Psychiatric: He has a normal mood and affect. His behavior is normal.  Nursing note and vitals reviewed.    ED Treatments / Results  Labs (all labs ordered are listed, but only abnormal results are displayed) Labs Reviewed  BASIC METABOLIC PANEL - Abnormal; Notable for the following components:      Result Value   Glucose, Bld 109 (*)    All other components within normal limits  HEPATIC FUNCTION PANEL - Abnormal; Notable for the following components:   Bilirubin, Direct <0.1 (*)    All other components within normal limits  URINALYSIS, ROUTINE W REFLEX MICROSCOPIC - Abnormal; Notable for the following components:   Color, Urine STRAW (*)    All other components within normal limits  CBC  LIPASE, BLOOD  I-STAT TROPONIN, ED  I-STAT TROPONIN, ED    EKG  EKG Interpretation  Date/Time:  Saturday March 10 2017 09:33:06 EST Ventricular Rate:  81 PR Interval:  134 QRS Duration: 94 QT Interval:  382 QTC Calculation: 443 R Axis:   52 Text Interpretation:  Normal sinus rhythm Incomplete right bundle branch block Nonspecific T wave abnormality Abnormal ECG agree. no acute ischemia. Confirmed by Arby Barrette [id number]) on 03/10/2017 5:18:43 PM       Radiology Dg Chest 2 View  Result Date: 03/10/2017 CLINICAL DATA:  Bilateral chest pain EXAM: CHEST - 2 VIEW COMPARISON:  01/23/2008 FINDINGS: Heart and mediastinal contours are within normal limits. No focal opacities or effusions. No acute bony abnormality. IMPRESSION: No active cardiopulmonary disease. Electronically Signed   By: Charlett Nose M.D.   On: 03/10/2017 10:12   Ct Head Wo Contrast  Result Date: 03/10/2017 CLINICAL DATA:  Headache for the past 2  weeks, worse this morning. History of hypertension. EXAM: CT HEAD WITHOUT CONTRAST TECHNIQUE: Contiguous axial images were obtained from the base of the skull through the vertex without intravenous contrast. COMPARISON:  None. FINDINGS: Brain: Gray-white differentiation is maintained. No CT evidence of acute large territory infarct. No intraparenchymal or extra-axial mass or hemorrhage. Normal size and configuration of the ventricles and the basilar cisterns. No midline shift. Vascular: No hyperdense vessel or unexpected calcification. Skull: No displaced calvarial fracture. Sinuses/Orbits: There is under pneumatization of the bilateral frontal sinuses. The remaining paranasal sinuses and mastoid air cells are normally aerated. No air-fluid levels. Other: Regional soft tissues appear normal. IMPRESSION: Negative noncontrast head CT. Electronically Signed   By: Simonne Come M.D.   On: 03/10/2017 11:44    Procedures Procedures (including critical care time)  Medications Ordered in ED Medications  sodium chloride 0.9 % bolus 1,000 mL (0 mLs Intravenous Stopped 03/10/17 1322)  ketorolac (TORADOL) 30 MG/ML injection 30 mg (30 mg Intravenous Given 03/10/17 1126)  famotidine (PEPCID) IVPB 20 mg premix (0 mg Intravenous Stopped 03/10/17 1322)  metoCLOPramide (REGLAN) injection 10 mg (10 mg Intravenous Given 03/10/17 1125)  diphenhydrAMINE (BENADRYL) injection 25 mg (25 mg Intravenous Given 03/10/17 1126)     Initial Impression / Assessment and Plan / ED Course  I have reviewed the triage vital signs and the nursing notes.  Pertinent labs & imaging results that were available during my care of the patient were reviewed by me and considered in my medical decision making (see chart for details).     Patient presents with 2 weeks of intermittent epigastric pain which he initially described as chest pain and intermittent left-sided headaches and lightheadedness.  He is afebrile, mildly hypertensive while in the ED  with improvement on reevaluation.  No focal neurologic deficits.  No red flag signs concerning for [facility], ICH, or CVA.  CT of the head shows no acute intracranial abnormalities.  He does have a history of migraines.  Headache improved significantly with migraine cocktail.  The pain patient describes in his abdomen is reproducible on palpation of the left upper quadrant and epigastric regions.  Serial troponins are negative and EKG shows no evidence of ST segment abnormality, ischemia, or arrhythmia.  Symptoms do not sound cardiac in nature.  Chest x-ray shows no active cardiopulmonary disease.  No evidence of pericarditis or myocarditis.  His lipase is within normal limits.  Remainder of lab work is reassuring with no leukocytosis, no abnormal LFTs or creatinine, and with UA not concerning for UTI or nephrolithiasis.  I  do not think he requires advanced imaging of his abdomen at this time.  His symptoms improved with IV Pepcid.  History and physical examination suggestive of PUD/GERD.  Will discharge with Prilosec and Carafate.  Recommend follow-up with primary care physician for reevaluation of symptoms.  Discussed indications for return to the ED. Pt verbalized understanding of and agreement with plan and is safe for discharge home at this time.  Translator was used throughout the entirety of the encounter.  Final Clinical Impressions(s) / ED Diagnoses   Final diagnoses:  Epigastric pain  Atypical chest pain  Left-sided headache    ED Discharge Orders        Ordered    omeprazole (PRILOSEC) 20 MG capsule  2 times daily before meals     03/10/17 1506    sucralfate (CARAFATE) 1 g tablet  3 times daily with meals & bedtime     03/10/17 [id number]       Sanjuana Mruk A, PA-C 03/10/17 1727    Arby BarrettePfeiffer, Marcy, MD 03/12/17 [phone]

## 2020-01-12 ENCOUNTER — Encounter (HOSPITAL_COMMUNITY): Payer: Self-pay

## 2020-01-12 ENCOUNTER — Ambulatory Visit (HOSPITAL_COMMUNITY)
Admission: EM | Admit: 2020-01-12 | Discharge: 2020-01-12 | Disposition: A | Discharge status: Home / Self Care | Payer: BC Managed Care – PPO | Attending: Student | Admitting: Student

## 2020-01-12 ENCOUNTER — Other Ambulatory Visit: Payer: Self-pay

## 2020-01-12 DIAGNOSIS — J069 Acute upper respiratory infection, unspecified: Secondary | ICD-10-CM

## 2020-01-12 DIAGNOSIS — U071 COVID-19: Secondary | ICD-10-CM | POA: Diagnosis not present

## 2020-01-12 LAB — SARS CORONAVIRUS 2 (TAT 6-24 HRS): SARS Coronavirus 2: POSITIVE — AB

## 2020-01-12 NOTE — ED Provider Notes (Signed)
[facility]    CSN: [id number] Arrival date & time: 01/12/20  5643      History   Chief Complaint Chief Complaint  Patient presents with  . Fever  . Cough    HPI Bernard Hardy is a 55 y.o. male Presenting for URI symptoms for 1 day. History of hypertension. Endorses fevers and cough. Denies  n/v/d, shortness of breath, chest pain, congestion, facial pain, teeth pain, headaches, sore throat, loss of taste/smell, swollen lymph nodes, ear pain. Denies chest pain, shortness of breath, confusion, high fevers. Fully vaccinated for covid-19.  HPI  Past Medical History:  Diagnosis Date  . Hypertension     Patient Active Problem List   Diagnosis Date Noted  . SINUSITIS- ACUTE-NOS 01/23/2008  . RASH-NONVESICULAR 01/23/2008  . CHEST PAIN 01/23/2008  . ABDOMINAL PAIN, RIGHT LOWER QUADRANT 01/23/2008  . HYPERLIPIDEMIA 06/20/2007  . ANXIETY 06/20/2007  . Depressive disorder, not elsewhere classified 06/20/2007  . COMMON MIGRAINE 06/20/2007  . HYPERTENSION 06/20/2007  . ALLERGIC RHINITIS 06/20/2007  . 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 GERD 06/20/2007  . IBS 06/20/2007  . HEMATOCHEZIA 06/20/2007    Past Surgical History:  Procedure Laterality Date  . APPENDECTOMY         Home Medications    Prior to Admission medications   Medication Sig Start Date End Date Taking? Authorizing Provider  Multiple Vitamins-Minerals (MULTIVITAMIN ADULT PO) Take 1 tablet by mouth daily.    [provider]  omeprazole (PRILOSEC) 20 MG capsule Take 1 capsule (20 mg total) by mouth 2 (two) times daily before a meal for 14 days. 03/10/17 03/24/17  Michela Pitcher A, PA-C  ondansetron (ZOFRAN) 4 MG tablet Take 1 tablet (4 mg total) by mouth every 6 (six) hours. 07/29/14   Blane Ohara, MD  sucralfate (CARAFATE) 1 g tablet Take 1 tablet (1 g total) by mouth 4 (four) times daily -  with meals and at bedtime for 14 days. 03/10/17 03/24/17  Jeanie Sewer, PA-C    Family History History  reviewed. No pertinent family history.  Social History Social History   Tobacco Use  . Smoking status: Never Smoker  . Smokeless tobacco: Never Used  Substance Use Topics  . Alcohol use: No  . Drug use: No     Allergies   Patient has no known allergies.   Review of Systems Review of Systems  Constitutional: Positive for fever. Negative for appetite change and chills.  HENT: Negative for congestion, ear pain, rhinorrhea, sinus pressure, sinus pain and sore throat.   Eyes: Negative for redness and visual disturbance.  Respiratory: Positive for cough. Negative for chest tightness, shortness of breath and wheezing.   Cardiovascular: Negative for chest pain and palpitations.  Gastrointestinal: Negative for abdominal pain, constipation, diarrhea, nausea and vomiting.  Genitourinary: Negative for dysuria, frequency and urgency.  Musculoskeletal: Negative for myalgias.  Neurological: Negative for dizziness, weakness and headaches.  Psychiatric/Behavioral: Negative for confusion.  All other systems reviewed and are negative.    Physical Exam Triage Vital Signs ED Triage Vitals  Enc Vitals Group     BP 01/12/20 1100 124/79     Pulse Rate 01/12/20 1100 (!) 111     Resp 01/12/20 1100 18     Temp 01/12/20 1100 99.1 F (37.3 C)     Temp Source 01/12/20 1100 Oral     SpO2 01/12/20 1100 99 %     Weight --      Height --  Head Circumference --      Peak Flow --      Pain Score 01/12/20 1058 0     Pain Loc --      Pain Edu? --      Excl. in GC? --    No data found.  Updated Vital Signs BP 124/79 (BP Location: Right Arm)   Pulse (!) 111   Temp 99.1 F (37.3 C) (Oral)   Resp 18   SpO2 99%   Visual Acuity Right Eye Distance:   Left Eye Distance:   Bilateral Distance:    Right Eye Near:   Left Eye Near:    Bilateral Near:     Physical Exam Vitals reviewed.  Constitutional:      General: He is not in acute distress.    Appearance: Normal appearance. He is not  ill-appearing.  HENT:     Head: Normocephalic and atraumatic.     Right Ear: Hearing, tympanic membrane, ear canal and external ear normal. No swelling or tenderness. There is no impacted cerumen. No mastoid tenderness. Tympanic membrane is not perforated, erythematous, retracted or bulging.     Left Ear: Hearing, tympanic membrane, ear canal and external ear normal. No swelling or tenderness. There is no impacted cerumen. No mastoid tenderness. Tympanic membrane is not perforated, erythematous, retracted or bulging.     Nose:     Right Sinus: No maxillary sinus tenderness or frontal sinus tenderness.     Left Sinus: No maxillary sinus tenderness or frontal sinus tenderness.     Mouth/Throat:     Mouth: Mucous membranes are moist.     Pharynx: Uvula midline. No oropharyngeal exudate or posterior oropharyngeal erythema.     Tonsils: No tonsillar exudate.  Cardiovascular:     Rate and Rhythm: Normal rate and regular rhythm.     Heart sounds: Normal heart sounds.  Pulmonary:     Breath sounds: Normal breath sounds and air entry. No wheezing, rhonchi or rales.  Chest:     Chest wall: No tenderness.  Abdominal:     General: Abdomen is flat. Bowel sounds are normal.     Tenderness: There is no abdominal tenderness. There is no guarding or rebound.  Lymphadenopathy:     Cervical: No cervical adenopathy.  Neurological:     General: No focal deficit present.     Mental Status: He is alert and oriented to person, place, and time.  Psychiatric:        Attention and Perception: Attention and perception normal.        Mood and Affect: Mood and affect normal.        Behavior: Behavior normal. Behavior is cooperative.        Thought Content: Thought content normal.        Judgment: Judgment normal.      UC Treatments / Results  Labs (all labs ordered are listed, but only abnormal results are displayed) Labs Reviewed  SARS CORONAVIRUS 2 (TAT 6-24 HRS)    EKG   Radiology No results  found.  Procedures Procedures (including critical care time)  Medications Ordered in UC Medications - No data to display  Initial Impression / Assessment and Plan / UC Course  I have reviewed the triage vital signs and the nursing notes.  Pertinent labs & imaging results that were available during my care of the patient were reviewed by me and considered in my medical decision making (see chart for details).     -Promethazine DM  cough syrup for congestion/cough. This could make you drowsy, so take at night before bed. -Tessalon as needed for cough. Take one pill up to 3x daily (every 8 hours) -For fevers/chills, body aches, headaches- use Tylenol and Ibuprofen. You can alternate these for maximum effect. Use up to 3000mg  Tylenol daily and 3200mg  Ibuprofen daily. Make sure to take ibuprofen with food. Check the bottle of ibuprofen/tylenol for specific dosage instructions.  Covid test sent today. Patient is fully vaccinated for covid-19. Isolation precautions per CDC guidelines until negative result. Symptomatic relief with OTC Mucinex, Nyquil, etc. Return precautions- new/worsening fevers/chills, shortness of breath, chest pain, abd pain, etc.   Final Clinical Impressions(s) / UC Diagnoses   Final diagnoses:  Viral upper respiratory tract infection     Discharge Instructions     -Promethazine DM cough syrup for congestion/cough. This could make you drowsy, so take at night before bed. -Tessalon as needed for cough. Take one pill up to 3x daily (every 8 hours) -For fevers/chills, body aches, headaches- use Tylenol and Ibuprofen. You can alternate these for maximum effect. Use up to 3000mg  Tylenol daily and 3200mg  Ibuprofen daily. Make sure to take ibuprofen with food. Check the bottle of ibuprofen/tylenol for specific dosage instructions.  We are currently awaiting result of your PCR covid-19 test. This typically comes back in 1-2 days. We'll call you if the result is positive.  Otherwise, the result will be sent electronically to your MyChart. You can also call this clinic and ask for your result via telephone.   Please isolate at home while awaiting these results. If your test is positive for Covid-19, continue to isolate at home for 5 days if you have mild symptoms, or a total of 10 days from symptom onset if you have more severe symptoms. If you quarantine for a shorter period of time (i.e. 5 days), make sure to wear a mask until day 10 of symptoms. Treat your symptoms at home with OTC remedies like tylenol/ibuprofen, mucinex, nyquil, etc. Seek medical attention if you develop high fevers, chest pain, shortness of breath, ear pain, facial pain, etc. Make sure to get up and move around every 2-3 hours while convalescing to help prevent blood clots. Drink plenty of fluids, and rest as much as possible.     ED Prescriptions    None     PDMP not reviewed this encounter.   , PA-C 01/12/20 1129

## 2020-01-12 NOTE — Discharge Instructions (Addendum)
-  Promethazine DM cough syrup for congestion/cough. This could make you drowsy, so take at night before bed. -Tessalon as needed for cough. Take one pill up to 3x daily (every 8 hours) -For fevers/chills, body aches, headaches- use Tylenol and Ibuprofen. You can alternate these for maximum effect. Use up to 3000mg  Tylenol daily and 3200mg  Ibuprofen daily. Make sure to take ibuprofen with food. Check the bottle of ibuprofen/tylenol for specific dosage instructions.  We are currently awaiting result of your PCR covid-19 test. This typically comes back in 1-2 days. We'll call you if the result is positive. Otherwise, the result will be sent electronically to your MyChart. You can also call this clinic and ask for your result via telephone.   Please isolate at home while awaiting these results. If your test is positive for Covid-19, continue to isolate at home for 5 days if you have mild symptoms, or a total of 10 days from symptom onset if you have more severe symptoms. If you quarantine for a shorter period of time (i.e. 5 days), make sure to wear a mask until day 10 of symptoms. Treat your symptoms at home with OTC remedies like tylenol/ibuprofen, mucinex, nyquil, etc. Seek medical attention if you develop high fevers, chest pain, shortness of breath, ear pain, facial pain, etc. Make sure to get up and move around every 2-3 hours while convalescing to help prevent blood clots. Drink plenty of fluids, and rest as much as possible.

## 2020-01-12 NOTE — ED Triage Notes (Signed)
Pt presents with fever and cough since last night. Pt ca not recall the temp as he did not have a thermometer.

## 2021-10-29 ENCOUNTER — Ambulatory Visit: Payer: BC Managed Care – PPO

## 2021-10-29 DIAGNOSIS — Z23 Encounter for immunization: Secondary | ICD-10-CM

## 2023-03-02 ENCOUNTER — Ambulatory Visit (HOSPITAL_COMMUNITY)
Admission: EM | Admit: 2023-03-02 | Discharge: 2023-03-02 | Disposition: A | Discharge status: Home / Self Care | Payer: Managed Care, Other (non HMO) | Attending: Family Medicine | Admitting: Family Medicine

## 2023-03-02 ENCOUNTER — Encounter (HOSPITAL_COMMUNITY): Payer: Self-pay

## 2023-03-02 DIAGNOSIS — L738 Other specified follicular disorders: Secondary | ICD-10-CM | POA: Diagnosis not present

## 2023-03-02 DIAGNOSIS — J069 Acute upper respiratory infection, unspecified: Secondary | ICD-10-CM

## 2023-03-02 MED ORDER — BENZONATATE 100 MG PO CAPS: 100.0000 mg | ORAL_CAPSULE | Freq: Three times a day (TID) | ORAL | 0 refills | Status: AC | PRN

## 2023-03-05 ENCOUNTER — Other Ambulatory Visit: Payer: Self-pay

## 2023-03-05 ENCOUNTER — Emergency Department (HOSPITAL_COMMUNITY)

## 2023-03-05 ENCOUNTER — Emergency Department (HOSPITAL_COMMUNITY)
Admission: EM | Admit: 2023-03-05 | Discharge: 2023-03-06 | Disposition: A | Discharge status: Home / Self Care | Attending: Emergency Medicine | Admitting: Emergency Medicine

## 2023-03-05 DIAGNOSIS — R799 Abnormal finding of blood chemistry, unspecified: Secondary | ICD-10-CM | POA: Diagnosis not present

## 2023-03-05 DIAGNOSIS — J101 Influenza due to other identified influenza virus with other respiratory manifestations: Secondary | ICD-10-CM | POA: Insufficient documentation

## 2023-03-05 DIAGNOSIS — R059 Cough, unspecified: Secondary | ICD-10-CM | POA: Diagnosis present

## 2023-03-05 LAB — CBC
HCT: 43.1 % (ref 39.0–52.0)
Hemoglobin: 14 g/dL (ref 13.0–17.0)
MCH: 27.2 pg (ref 26.0–34.0)
MCHC: 32.5 g/dL (ref 30.0–36.0)
MCV: 83.7 fL (ref 80.0–100.0)
Platelets: 210 10*3/uL (ref 150–400)
RBC: 5.15 MIL/uL (ref 4.22–5.81)
RDW: 11.7 % (ref 11.5–15.5)
WBC: 11 10*3/uL — ABNORMAL HIGH (ref 4.0–10.5)
nRBC: 0 % (ref 0.0–0.2)

## 2023-03-05 LAB — BASIC METABOLIC PANEL
Anion gap: 10 (ref 5–15)
BUN: 10 mg/dL (ref 6–20)
CO2: 26 mmol/L (ref 22–32)
Calcium: 9.1 mg/dL (ref 8.9–10.3)
Chloride: 102 mmol/L (ref 98–111)
Creatinine, Ser: 1.18 mg/dL (ref 0.61–1.24)
GFR, Estimated: >60 mL/min (ref >60)
Glucose, Bld: 144 mg/dL — ABNORMAL HIGH (ref 70–99)
Potassium: 3.6 mmol/L (ref 3.5–5.1)
Sodium: 138 mmol/L (ref 135–145)

## 2023-03-05 LAB — TROPONIN I (HIGH SENSITIVITY): Troponin I (High Sensitivity): 6 ng/L (ref <18)

## 2023-03-05 NOTE — ED Triage Notes (Signed)
 Patient reports left chest pain with productive cough/chest congestion and headache this week .

## 2023-03-06 ENCOUNTER — Encounter (HOSPITAL_COMMUNITY): Payer: Self-pay

## 2023-03-06 LAB — RESP PANEL BY RT-PCR (RSV, FLU A&B, COVID)  RVPGX2
Influenza A by PCR: POSITIVE — AB
Influenza B by PCR: NEGATIVE
Resp Syncytial Virus by PCR: NEGATIVE
SARS Coronavirus 2 by RT PCR: NEGATIVE

## 2023-03-06 LAB — URINALYSIS, ROUTINE W REFLEX MICROSCOPIC
Bilirubin Urine: NEGATIVE
Glucose, UA: NEGATIVE mg/dL
Hgb urine dipstick: NEGATIVE
Ketones, ur: NEGATIVE mg/dL
Leukocytes,Ua: NEGATIVE
Nitrite: NEGATIVE
Protein, ur: NEGATIVE mg/dL
Specific Gravity, Urine: 1.01 (ref 1.005–1.030)
pH: 6 (ref 5.0–8.0)

## 2023-03-06 LAB — I-STAT CG4 LACTIC ACID, ED
Lactic Acid, Venous: 2.4 mmol/L (ref 0.5–1.9)
Lactic Acid, Venous: 1.4 mmol/L (ref 0.5–1.9)

## 2023-03-06 LAB — TROPONIN I (HIGH SENSITIVITY): Troponin I (High Sensitivity): 5 ng/L (ref <18)

## 2023-03-06 LAB — LIPASE, BLOOD: Lipase: 28 U/L (ref 11–51)

## 2023-03-06 LAB — CBG MONITORING, ED: Glucose-Capillary: 174 mg/dL — ABNORMAL HIGH (ref 70–99)

## 2023-03-06 MED ORDER — ONDANSETRON HCL 4 MG/2ML IJ SOLN
4.0000 mg | Freq: Once | INTRAMUSCULAR | Status: AC
  Administered 2023-03-06: 4 mg via INTRAVENOUS
  Filled 2023-03-06: qty 2

## 2023-03-06 MED ORDER — ONDANSETRON HCL 4 MG PO TABS: 4.0000 mg | ORAL_TABLET | Freq: Four times a day (QID) | ORAL | 0 refills | Status: AC

## 2023-03-06 MED ORDER — IPRATROPIUM-ALBUTEROL 0.5-2.5 (3) MG/3ML IN SOLN
3.0000 mL | Freq: Once | RESPIRATORY_TRACT | Status: AC
  Administered 2023-03-06: 3 mL via RESPIRATORY_TRACT
  Filled 2023-03-06: qty 3

## 2023-03-06 MED ORDER — LACTATED RINGERS IV BOLUS
1000.0000 mL | Freq: Once | INTRAVENOUS | Status: AC
  Administered 2023-03-06: 1000 mL via INTRAVENOUS

## 2023-03-06 NOTE — Discharge Instructions (Addendum)
 Please follow-up with your primary care provider regards recent ER visit. Today your labs and imaging are reassuring and you tested positive for the flu. Please use Tylenol every 6 hours as needed for pain. I have prescribed Zofran in case you become nauseous. Please remain hydrated and eat food as tolerated. If symptoms change or worsen please return to the ER.

## 2023-03-06 NOTE — ED Provider Notes (Signed)
 [place] EMERGENCY DEPARTMENT AT [facility] Provider Note   CSN: [id number] Arrival date & time: 03/05/23  1953     History  Chief Complaint  Patient presents with   Chest Pain /Cough/Headache    Bernard Hardy is a 58 y.o. male history of GERD, IBS, depression, migraines presented for chest pain cough headache for the past week.  Patient does have sick contact at home with similar symptoms.  Patient does note has had loose stools recently along with urinating more but denies any abdominal pain.  There have been some episodes of emesis along with nausea.  Patient not been able to keep down fluids.  Patient is unsure of fevers.  Patient states he has chest congestion with discomfort across his entire chest but denies productive cough.  I offered a [place] however patient would like wife to translate  Home Medications Prior to Admission medications   Medication Sig Start Date End Date Taking? Authorizing Provider  ondansetron (ZOFRAN) 4 MG tablet Take 1 tablet (4 mg total) by mouth every 6 (six) hours. 03/06/23  Yes Jereld Presti, Beverly Gust, PA-C  Multiple Vitamins-Minerals (MULTIVITAMIN ADULT PO) Take 1 tablet by mouth daily.    [provider]  omeprazole (PRILOSEC) 20 MG capsule Take 1 capsule (20 mg total) by mouth 2 (two) times daily before a meal for 14 days. 03/10/17 03/24/17  Michela Pitcher A, PA-C  sucralfate (CARAFATE) 1 g tablet Take 1 tablet (1 g total) by mouth 4 (four) times daily -  with meals and at bedtime for 14 days. 03/10/17 03/24/17  Michela Pitcher A, PA-C      Allergies    Patient has no known allergies.    Review of Systems   Review of Systems  Physical Exam Updated Vital Signs BP 107/75   Pulse 76   Temp 97.7 F (36.5 C) (Oral)   Resp 15   SpO2 96%  Physical Exam Vitals reviewed.  Constitutional:      General: He is not in acute distress. HENT:     Head: Normocephalic and atraumatic.     Mouth/Throat:     Mouth: Mucous membranes  are dry.  Eyes:     Extraocular Movements: Extraocular movements intact.     Conjunctiva/sclera: Conjunctivae normal.     Pupils: Pupils are equal, round, and reactive to light.  Cardiovascular:     Rate and Rhythm: Normal rate and regular rhythm.     Pulses: Normal pulses.     Heart sounds: Normal heart sounds.     Comments: 2+ bilateral radial/dorsalis pedis pulses with regular rate Pulmonary:     Effort: Pulmonary effort is normal. No respiratory distress.     Breath sounds: Normal breath sounds.  Abdominal:     Palpations: Abdomen is soft.     Tenderness: There is no abdominal tenderness. There is no guarding or rebound.  Musculoskeletal:        General: Normal range of motion.     Cervical back: Normal range of motion and neck supple.     Comments: 5 out of 5 bilateral grip/leg extension strength  Skin:    General: Skin is warm and dry.     Capillary Refill: Capillary refill takes 2 to 3 seconds.  Neurological:     General: No focal deficit present.     Mental Status: He is alert and oriented to person, place, and time.     Comments: Sensation intact in all 4 limbs  Psychiatric:  Mood and Affect: Mood normal.     ED Results / Procedures / Treatments   Labs (all labs ordered are listed, but only abnormal results are displayed) Labs Reviewed  RESP PANEL BY RT-PCR (RSV, FLU A&B, COVID)  RVPGX2 - Abnormal; Notable for the following components:      Result Value   Influenza A by PCR POSITIVE (*)    All other components within normal limits  BASIC METABOLIC PANEL - Abnormal; Notable for the following components:   Glucose, Bld 144 (*)    All other components within normal limits  CBC - Abnormal; Notable for the following components:   WBC 11.0 (*)    All other components within normal limits  CBG MONITORING, ED - Abnormal; Notable for the following components:   Glucose-Capillary 174 (*)    All other components within normal limits  I-STAT CG4 LACTIC ACID, ED -  Abnormal; Notable for the following components:   Lactic Acid, Venous 2.4 (*)    All other components within normal limits  URINALYSIS, ROUTINE W REFLEX MICROSCOPIC  LIPASE, BLOOD  I-STAT CG4 LACTIC ACID, ED  TROPONIN I (HIGH SENSITIVITY)  TROPONIN I (HIGH SENSITIVITY)    EKG None  Radiology DG Chest 2 View Result Date: 03/05/2023 CLINICAL DATA:  Chest pain EXAM: CHEST - 2 VIEW COMPARISON:  Chest radiograph 03/10/2017 FINDINGS: The heart size and mediastinal contours are within normal limits. Both lungs are clear. The visualized skeletal structures are unremarkable. IMPRESSION: No active cardiopulmonary disease. Electronically Signed   By: Annia Belt M.D.   On: 03/05/2023 22:21    Procedures Procedures    Medications Ordered in ED Medications  lactated ringers bolus 1,000 mL (0 mLs Intravenous Stopped 03/06/23 0320)  ondansetron (ZOFRAN) injection 4 mg (4 mg Intravenous Given 03/06/23 0317)  ipratropium-albuterol (DUONEB) 0.5-2.5 (3) MG/3ML nebulizer solution 3 mL (3 mLs Nebulization Given 03/06/23 0243)    ED Course/ Medical Decision Making/ A&P                                 Medical Decision Making Amount and/or Complexity of Data Reviewed Labs: ordered. Radiology: ordered.  Risk Prescription drug management.   Bernard Hardy 58 y.o. presented today for URI like symptoms. Working DDx that I considered at this time includes, but not limited to, viral illness, pharyngitis, mono, sinusitis, electrolyte abnormality, AOM, pneumonia.  R/o DDx: pharyngitis, mono, sinusitis, electrolyte abnormality, AOM, pneumonia: these diagnoses are not consistent with patient's history, presentation, physical exam, labs/imaging findings.  Review of prior external notes: 01/12/2020 ED  Unique Tests and My Independent Interpretation:  Respiratory Panel: Influenza A positive CBC: Unremarkable Troponin: 6, 5 Lipase: Unremarkable CBG: 174 Lactic acid: 2.4, 1.4 UA: Unremarkable Chest  x-ray: Unremarkable EKG: Sinus 81 bpm, no signs of ST elevations or depressions  Social Determinants of Health: none  Discussion with Independent Historian:  Wife  Discussion of Management of Tests: None  Risk: Medium: prescription drug management  Risk Stratification Score: None  Plan: On exam patient was initially in acute distress and mildly hypotensive with systolic of 99.  Patient had nonproductive cough on exam but otherwise had clear lungs.  Patient is able speak in full sentences.  Patient did have cool and clammy skin and so with his history of fluid depletion we will give him fluids along with Zofran help with any nausea.  Patient did have lactic acid of 2.4 and is currently receiving  fluids.  Patient did test positive for flu a which I do suspect is causing his symptoms.  Will continue to monitor and give fluids and if able to tolerate p.o. do believe patient can go home.  I went to go reevaluate the patient patient states he feels much better after the fluids breathing treatment and Zofran.  Patient's repeat lactic is negative.  Will p.o. challenge and if successful will discharge with Zofran and primary care follow-up.  Patient was p.o. challenged and is safe to be discharged at this time follow-up outpatient.  Patient is not requiring any oxygen and has not required any oxygen at any point during the ED stay.  I recommended patient use Tylenol every 6 hours needed for pain remain hydrated.  Will prescribe Zofran in case patient becomes nauseous.  Recommend patient follows up with primary care provider.  At this time patient is safe to be discharged.  Patient was given return precautions. Patient stable for discharge at this time.  Patient verbalized understanding of plan.  This chart was dictated using voice recognition software.  Despite best efforts to proofread,  errors can occur which can change the documentation meaning.         Final Clinical Impression(s) / ED  Diagnoses Final diagnoses:  Influenza A    Rx / DC Orders ED Discharge Orders          Ordered    ondansetron (ZOFRAN) 4 MG tablet  Every 6 hours        03/06/23 0441              Netta Corrigan, PA-C 03/06/23 0451    Mesner, Barbara Cower, MD 03/06/23 [phone]

## 2023-03-11 ENCOUNTER — Encounter (HOSPITAL_COMMUNITY): Payer: Self-pay | Admitting: Emergency Medicine

## 2023-03-11 ENCOUNTER — Emergency Department (HOSPITAL_COMMUNITY)
Admission: EM | Admit: 2023-03-11 | Discharge: 2023-03-11 | Disposition: A | Discharge status: Home / Self Care | Attending: Emergency Medicine | Admitting: Emergency Medicine

## 2023-03-11 ENCOUNTER — Other Ambulatory Visit: Payer: Self-pay

## 2023-03-11 ENCOUNTER — Emergency Department (HOSPITAL_COMMUNITY)

## 2023-03-11 DIAGNOSIS — R051 Acute cough: Secondary | ICD-10-CM

## 2023-03-11 DIAGNOSIS — I10 Essential (primary) hypertension: Secondary | ICD-10-CM | POA: Insufficient documentation

## 2023-03-11 DIAGNOSIS — J189 Pneumonia, unspecified organism: Secondary | ICD-10-CM

## 2023-03-11 DIAGNOSIS — J111 Influenza due to unidentified influenza virus with other respiratory manifestations: Secondary | ICD-10-CM | POA: Diagnosis present

## 2023-03-11 LAB — CBC
HCT: 43.6 % (ref 39.0–52.0)
Hemoglobin: 13.8 g/dL (ref 13.0–17.0)
MCH: 26.8 pg (ref 26.0–34.0)
MCHC: 31.7 g/dL (ref 30.0–36.0)
MCV: 84.8 fL (ref 80.0–100.0)
Platelets: 351 10*3/uL (ref 150–400)
RBC: 5.14 MIL/uL (ref 4.22–5.81)
RDW: 11.8 % (ref 11.5–15.5)
WBC: 8.7 10*3/uL (ref 4.0–10.5)
nRBC: 0 % (ref 0.0–0.2)

## 2023-03-11 LAB — BASIC METABOLIC PANEL
Anion gap: 11 (ref 5–15)
BUN: 6 mg/dL (ref 6–20)
CO2: 28 mmol/L (ref 22–32)
Calcium: 9.2 mg/dL (ref 8.9–10.3)
Chloride: 101 mmol/L (ref 98–111)
Creatinine, Ser: 1.08 mg/dL (ref 0.61–1.24)
GFR, Estimated: >60 mL/min (ref >60)
Glucose, Bld: 108 mg/dL — ABNORMAL HIGH (ref 70–99)
Potassium: 3.8 mmol/L (ref 3.5–5.1)
Sodium: 140 mmol/L (ref 135–145)

## 2023-03-11 MED ORDER — DOXYCYCLINE HYCLATE 100 MG PO CAPS: 100.0000 mg | ORAL_CAPSULE | Freq: Two times a day (BID) | ORAL | 0 refills | Status: AC

## 2023-03-11 MED ORDER — GUAIFENESIN-DM 100-10 MG/5ML PO SYRP
5.0000 mL | ORAL_SOLUTION | Freq: Once | ORAL | Status: AC
  Administered 2023-03-11: 5 mL via ORAL
  Filled 2023-03-11: qty 5

## 2023-03-11 MED ORDER — PREDNISONE 20 MG PO TABS
60.0000 mg | ORAL_TABLET | Freq: Once | ORAL | Status: AC
  Administered 2023-03-11: 60 mg via ORAL
  Filled 2023-03-11: qty 3

## 2023-03-11 MED ORDER — METHYLPREDNISOLONE 4 MG PO TBPK: ORAL_TABLET | ORAL | 0 refills | Status: AC

## 2023-03-11 MED ORDER — DOXYCYCLINE HYCLATE 100 MG PO TABS
100.0000 mg | ORAL_TABLET | Freq: Once | ORAL | Status: AC
  Administered 2023-03-11: 100 mg via ORAL
  Filled 2023-03-11: qty 1

## 2023-03-11 MED ORDER — IPRATROPIUM-ALBUTEROL 0.5-2.5 (3) MG/3ML IN SOLN
3.0000 mL | Freq: Once | RESPIRATORY_TRACT | Status: AC
  Administered 2023-03-11: 3 mL via RESPIRATORY_TRACT
  Filled 2023-03-11: qty 3

## 2023-03-11 MED ORDER — ALBUTEROL SULFATE HFA 108 (90 BASE) MCG/ACT IN AERS: 1.0000 | INHALATION_SPRAY | Freq: Four times a day (QID) | RESPIRATORY_TRACT | 0 refills | Status: AC | PRN

## 2023-03-11 NOTE — ED Provider Notes (Signed)
 [place] EMERGENCY DEPARTMENT AT [facility] Provider Note   CSN: [id number] Arrival date & time: 03/11/23  0019     History  Chief Complaint  Patient presents with   Influenza    Bernard Hardy is a 58 y.o. male.  Patient with history of hypertension, hyperlipidemia presents today with complaints of cough. States that same began initially 2 weeks ago and has been persistent since then. He originally went to urgent care on 2/28 and was given tessalon which he took with no relief. He also noted some bumps on his lip at that visit and was prescribed Keflex which he took with resolution. He then presented on 3/4 for these symptoms and was diagnosed with the flu. He presents today due to persistent cough. He has not tried any over the counter medications. Endorses some chest soreness from coughing but no pain otherwise. No nausea, vomiting, or diarrhea. No abdominal pain. No fevers or chills.  The history is provided by the patient. No language interpreter was used.  Influenza Presenting symptoms: cough        Home Medications Prior to Admission medications   Medication Sig Start Date End Date Taking? Authorizing Provider  benzonatate (TESSALON) 100 MG capsule Take 1 capsule (100 mg total) by mouth 3 (three) times daily as needed for cough. 03/02/23   Zenia Resides, MD  Multiple Vitamins-Minerals (MULTIVITAMIN ADULT PO) Take 1 tablet by mouth daily.    [provider]  omeprazole (PRILOSEC) 20 MG capsule Take 1 capsule (20 mg total) by mouth 2 (two) times daily before a meal for 14 days. 03/10/17 03/24/17  Michela Pitcher A, PA-C  ondansetron (ZOFRAN) 4 MG tablet Take 1 tablet (4 mg total) by mouth every 6 (six) hours. 03/06/23   Netta Corrigan, PA-C  sucralfate (CARAFATE) 1 g tablet Take 1 tablet (1 g total) by mouth 4 (four) times daily -  with meals and at bedtime for 14 days. 03/10/17 03/24/17  Michela Pitcher A, PA-C      Allergies    Patient has no known allergies.     Review of Systems   Review of Systems  Respiratory:  Positive for cough.   All other systems reviewed and are negative.   Physical Exam Updated Vital Signs BP (!) 161/95   Pulse 80   Temp 98.1 F (36.7 C) (Oral)   Resp 18   Ht 5' (1.524 m)   Wt 62.5 kg   SpO2 100%   BMI 26.91 kg/m  Physical Exam Vitals and nursing note reviewed.  Constitutional:      General: He is not in acute distress.    Appearance: Normal appearance. He is normal weight. He is not ill-appearing, toxic-appearing or diaphoretic.  HENT:     Head: Normocephalic and atraumatic.  Cardiovascular:     Rate and Rhythm: Normal rate and regular rhythm.     Heart sounds: Normal heart sounds.  Pulmonary:     Effort: Pulmonary effort is normal. No respiratory distress.     Breath sounds: Normal breath sounds.  Abdominal:     General: Abdomen is flat.     Palpations: Abdomen is soft.     Tenderness: There is no abdominal tenderness.  Musculoskeletal:        General: Normal range of motion.     Cervical back: Normal range of motion.     Right lower leg: No edema.     Left lower leg: No edema.  Skin:  General: Skin is warm and dry.  Neurological:     General: No focal deficit present.     Mental Status: He is alert.  Psychiatric:        Mood and Affect: Mood normal.        Behavior: Behavior normal.     ED Results / Procedures / Treatments   Labs (all labs ordered are listed, but only abnormal results are displayed) Labs Reviewed  BASIC METABOLIC PANEL - Abnormal; Notable for the following components:      Result Value   Glucose, Bld 108 (*)    All other components within normal limits  CBC    EKG None  Radiology DG Chest 2 View Result Date: 03/11/2023 CLINICAL DATA:  cough EXAM: CHEST - 2 VIEW COMPARISON:  March 05, 2023, March 10, 2017 FINDINGS: The cardiomediastinal silhouette is unchanged in contour. No pleural effusion. No pneumothorax. Mild peribronchial cuffing with mild bibasilar  reticular nodularity. Visualized abdomen is unremarkable. Multilevel degenerative changes of the thoracic spine. IMPRESSION: Mild peribronchial cuffing with mild bibasilar reticular nodularity. Findings are nonspecific but could reflect bronchitis or atypical infection. Electronically Signed   By: Meda Klinefelter M.D.   On: 03/11/2023 08:48    Procedures Procedures    Medications Ordered in ED Medications  ipratropium-albuterol (DUONEB) 0.5-2.5 (3) MG/3ML nebulizer solution 3 mL (3 mLs Nebulization Given 03/11/23 0936)  doxycycline (VIBRA-TABS) tablet 100 mg (100 mg Oral Given 03/11/23 0935)  predniSONE (DELTASONE) tablet 60 mg (60 mg Oral Given 03/11/23 0935)  guaiFENesin-dextromethorphan (ROBITUSSIN DM) 100-10 MG/5ML syrup 5 mL (5 mLs Oral Given 03/11/23 1610)    ED Course/ Medical Decision Making/ A&P                                 Medical Decision Making Amount and/or Complexity of Data Reviewed Labs: ordered. Radiology: ordered.  Risk OTC drugs. Prescription drug management.   This patient is a 58 y.o. male who presents to the ED for concern of cough, this involves an extensive number of treatment options, and is a complaint that carries with it a high risk of complications and morbidity. The emergent differential diagnosis prior to evaluation includes, but is not limited to,  Upper respiratory infection, lower respiratory infection, allergies/irritants, asthma, reflux, CHF, interstitial lung disease, foreign body, ACE inhibitors    This is not an exhaustive differential.   Past Medical History / Co-morbidities / Social History:  has a past medical history of Hypertension.  Additional history: Chart reviewed. Pertinent results include: seen on 2/28 at [facility], given Keflex and tessalon. Seen here on 3/04 had labs and imaging, diagnosed with flu, sent home with zofran.   Physical Exam: Physical exam performed. The pertinent findings include: well appearing, persistent cough on  exam, lung sounds CTA  Lab Tests: I ordered, and personally interpreted labs.  The pertinent results include:  no leukocytosis, glucose 108. No other acute laboratory abnormalities   Imaging Studies: I ordered imaging studies including CXR. I independently visualized and interpreted imaging which showed   Mild peribronchial cuffing with mild bibasilar reticular nodularity. Findings are nonspecific but could reflect bronchitis or atypical infection.  I agree with the radiologist interpretation   Medications: I ordered medication including doxycycline, robitussin, duoneb, prednisone  for cough. Reevaluation of the patient after these medicines showed that the patient improved. I have reviewed the patients home medicines and have made adjustments as needed.  Disposition: After  consideration of the diagnostic results and the patients response to treatment, I feel that emergency department workup does not suggest an emergent condition requiring admission or immediate intervention beyond what has been performed at this time. The plan is: discharge with doxycycline to cover for potential pneumonia seen on x-ray. Will also give a medrol dosepak and albuterol inhaler for cough given improvement with this in the ER today. After these medications, he is feeling much better and ready to go home. Evaluation and diagnostic testing in the emergency department does not suggest an emergent condition requiring admission or immediate intervention beyond what has been performed at this time.  Plan for discharge with close PCP follow-up.  Patient is understanding and amenable with plan, educated on red flag symptoms that would prompt immediate return.  Patient discharged in stable condition.  Final Clinical Impression(s) / ED Diagnoses Final diagnoses:  Atypical pneumonia  Acute cough    Rx / DC Orders ED Discharge Orders          Ordered    doxycycline (VIBRAMYCIN) 100 MG capsule  2 times daily         03/11/23 1116    methylPREDNISolone (MEDROL DOSEPAK) 4 MG TBPK tablet        03/11/23 1116    albuterol (VENTOLIN HFA) 108 (90 Base) MCG/ACT inhaler  Every 6 hours PRN        03/11/23 1116          An After Visit Summary was printed and given to the patient.     Vear Clock 03/11/23 1118    Linwood Dibbles, MD 03/12/23 [phone]

## 2023-03-11 NOTE — ED Triage Notes (Signed)
 Patient reports sore throat, cough, and headache for 2 weeks. Patient dx with flu on 3/3 and prescribed medication but patient reports he is still having symptoms.

## 2023-03-11 NOTE — ED Notes (Signed)
 Patient transported to X-ray

## 2023-03-11 NOTE — Discharge Instructions (Signed)
 As we discussed, your chest x-ray did show some signs of a possible pneumonia and therefore we are treating this with antibiotics.  I have given you a prescription for doxycycline that you need to fill and take as prescribed in its entirety for management of this.  Have also given you a steroid taper for you to take as prescribed in its entirety for management of your cough.  You can also use the inhaler as prescribed as needed. I recommend that you get plenty of rest and focus on symptomatic relief which includes Cepacol throat lozenges for sore throat, Mucinex for congestion, and tylenol/ibuprofen as needed for fevers and bodyaches. I also recommend:  Increased fluid intake. Sports drinks offer valuable electrolytes, sugars, and fluids.  Breathing heated mist or steam (vaporizer or shower).  Eating chicken soup or other clear broths, and maintaining good nutrition.   Increasing usage of your inhaler if you have asthma.  Return to work when your temperature has returned to normal.  Gargle warm salt water and spit it out for sore throat. Take benadryl or Zyrtec to decrease sinus secretions.  Follow Up: Follow up with your primary care doctor in 5-7 days for recheck of ongoing symptoms.  Return to emergency department for emergent changing or worsening of symptoms.

## 2023-03-15 ENCOUNTER — Encounter (HOSPITAL_COMMUNITY): Payer: Self-pay | Admitting: Emergency Medicine

## 2023-03-15 ENCOUNTER — Emergency Department (HOSPITAL_COMMUNITY)
Admission: EM | Admit: 2023-03-15 | Discharge: 2023-03-15 | Disposition: A | Discharge status: Home / Self Care | Attending: Emergency Medicine | Admitting: Emergency Medicine

## 2023-03-15 ENCOUNTER — Other Ambulatory Visit: Payer: Self-pay

## 2023-03-15 DIAGNOSIS — Z0289 Encounter for other administrative examinations: Secondary | ICD-10-CM | POA: Diagnosis not present

## 2023-03-15 DIAGNOSIS — I10 Essential (primary) hypertension: Secondary | ICD-10-CM | POA: Diagnosis not present

## 2023-03-15 DIAGNOSIS — Z79899 Other long term (current) drug therapy: Secondary | ICD-10-CM | POA: Diagnosis not present

## 2023-03-15 DIAGNOSIS — J101 Influenza due to other identified influenza virus with other respiratory manifestations: Secondary | ICD-10-CM | POA: Diagnosis present

## 2023-03-15 DIAGNOSIS — Z7689 Persons encountering health services in other specified circumstances: Secondary | ICD-10-CM

## 2023-03-15 NOTE — ED Triage Notes (Signed)
 Pt diagnosed with Flu last week and states he needs a note for work. Pt reports improvement of symptoms.

## 2023-03-15 NOTE — ED Notes (Signed)
 ..  The patient is A&OX4, ambulatory at d/c with independent steady gait, NAD. Pt verbalized understanding of d/c instructions and follow up care.

## 2023-03-15 NOTE — ED Provider Notes (Signed)
 [place] EMERGENCY DEPARTMENT AT [facility] Provider Note   CSN: [id number] Arrival date & time: 03/15/23  1755     History  Chief Complaint  Patient presents with   Influenza    Bernard Hardy is a 58 y.o. male with a history of hypertension who presents the ED today for a work note.  Patient was seen on 03/06/2023 for cough, headache, and chest pain was diagnosed with the flu.  He was out of work for a week but needs a note now to get back to work.  States that his symptoms have improved.  Denies any fevers and only a slight cough.  No chest pain or shortness of breath.  No additional complaints or concerns at this time.    Home Medications Prior to Admission medications   Medication Sig Start Date End Date Taking? Authorizing Provider  albuterol (VENTOLIN HFA) 108 (90 Base) MCG/ACT inhaler Inhale 1-2 puffs into the lungs every 6 (six) hours as needed for wheezing or shortness of breath. 03/11/23   Smoot, Shawn Route, PA-C  benzonatate (TESSALON) 100 MG capsule Take 1 capsule (100 mg total) by mouth 3 (three) times daily as needed for cough. 03/02/23   Zenia Resides, MD  doxycycline (VIBRAMYCIN) 100 MG capsule Take 1 capsule (100 mg total) by mouth 2 (two) times daily. 03/11/23   Smoot, Shawn Route, PA-C  methylPREDNISolone (MEDROL DOSEPAK) 4 MG TBPK tablet Take as directed on package 03/11/23   Smoot, Shawn Route, PA-C  Multiple Vitamins-Minerals (MULTIVITAMIN ADULT PO) Take 1 tablet by mouth daily.    [provider]  omeprazole (PRILOSEC) 20 MG capsule Take 1 capsule (20 mg total) by mouth 2 (two) times daily before a meal for 14 days. 03/10/17 03/24/17  Michela Pitcher A, PA-C  ondansetron (ZOFRAN) 4 MG tablet Take 1 tablet (4 mg total) by mouth every 6 (six) hours. 03/06/23   Netta Corrigan, PA-C  sucralfate (CARAFATE) 1 g tablet Take 1 tablet (1 g total) by mouth 4 (four) times daily -  with meals and at bedtime for 14 days. 03/10/17 03/24/17  Michela Pitcher A, PA-C      Allergies     Patient has no known allergies.    Review of Systems   Review of Systems  Constitutional:        Work note  All other systems reviewed and are negative.   Physical Exam Updated Vital Signs BP (!) 163/102 (BP Location: Right Arm)   Pulse 83   Temp 98.1 F (36.7 C)   Resp 16   SpO2 99%  Physical Exam Vitals and nursing note reviewed.  Constitutional:      General: He is not in acute distress.    Appearance: Normal appearance.  HENT:     Head: Normocephalic and atraumatic.     Mouth/Throat:     Mouth: Mucous membranes are moist.  Eyes:     Conjunctiva/sclera: Conjunctivae normal.     Pupils: Pupils are equal, round, and reactive to light.  Cardiovascular:     Rate and Rhythm: Normal rate and regular rhythm.     Pulses: Normal pulses.     Heart sounds: Normal heart sounds.  Pulmonary:     Effort: Pulmonary effort is normal.     Breath sounds: Normal breath sounds.  Abdominal:     Palpations: Abdomen is soft.     Tenderness: There is no abdominal tenderness.  Musculoskeletal:        General: Normal  range of motion.     Cervical back: Normal range of motion.  Skin:    General: Skin is warm and dry.     Findings: No rash.  Neurological:     General: No focal deficit present.     Mental Status: He is alert.  Psychiatric:        Mood and Affect: Mood normal.        Behavior: Behavior normal.     ED Results / Procedures / Treatments   Labs (all labs ordered are listed, but only abnormal results are displayed) Labs Reviewed - No data to display  EKG None  Radiology No results found.  Procedures Procedures: not indicated.   Medications Ordered in ED Medications - No data to display  ED Course/ Medical Decision Making/ A&P                                 Medical Decision Making  This patient presents to the ED for concern of work note, this involves an extensive number of treatment options, and is a complaint that carries with it a high risk of  complications and morbidity.    Comorbidities  See HPI above   Additional History  Additional history obtained from previous ED records   Problem List / ED Course / Critical Interventions / Medication Management  Patient was seen on 3/4 and diagnosed with the flu.  He states that his symptoms have resolved and is feeling a lot better.  Needs a note to go back to work at this time.  Denies fevers, shortness of breath, or chest pain.  No additional complaints or concerns. I have reviewed the patients home medicines and have made adjustments as needed   Social Determinants of Health  Occupation   Test / Admission - Considered  Patient is hemodynamically stable to for discharge home. Return precautions provided.       Final Clinical Impression(s) / ED Diagnoses Final diagnoses:  Return to work evaluation    Rx / DC Orders ED Discharge Orders     None         Maxwell Marion, PA-C 03/15/23 1820    Terrilee Files, MD 03/16/23 1110

## 2023-03-15 NOTE — Discharge Instructions (Addendum)
 As discussed, since your symptoms have improved and you are feeling better you can return to work tomorrow.  Follow-up with your primary care provider within the next week for reevaluation of your symptoms.  Return to the ED if your symptoms worsen in the interim.
